# Patient Record
Sex: Female | Born: 1935 | Race: White | Hispanic: No | Smoking: Never smoker
Health system: Southern US, Community
[De-identification: ages and names within clinical notes are randomized; demographics above are authoritative.]

## PROBLEM LIST (undated history)

## (undated) DIAGNOSIS — J189 Pneumonia, unspecified organism: Secondary | ICD-10-CM

## (undated) DIAGNOSIS — M4800 Spinal stenosis, site unspecified: Secondary | ICD-10-CM

## (undated) DIAGNOSIS — F32A Depression, unspecified: Secondary | ICD-10-CM

## (undated) DIAGNOSIS — R41841 Cognitive communication deficit: Secondary | ICD-10-CM

## (undated) DIAGNOSIS — I456 Pre-excitation syndrome: Secondary | ICD-10-CM

## (undated) DIAGNOSIS — E569 Vitamin deficiency, unspecified: Secondary | ICD-10-CM

## (undated) DIAGNOSIS — M1990 Unspecified osteoarthritis, unspecified site: Secondary | ICD-10-CM

## (undated) DIAGNOSIS — K589 Irritable bowel syndrome without diarrhea: Secondary | ICD-10-CM

## (undated) DIAGNOSIS — R471 Dysarthria and anarthria: Secondary | ICD-10-CM

## (undated) DIAGNOSIS — I10 Essential (primary) hypertension: Secondary | ICD-10-CM

## (undated) DIAGNOSIS — D649 Anemia, unspecified: Secondary | ICD-10-CM

## (undated) DIAGNOSIS — E871 Hypo-osmolality and hyponatremia: Secondary | ICD-10-CM

## (undated) DIAGNOSIS — R1310 Dysphagia, unspecified: Secondary | ICD-10-CM

## (undated) DIAGNOSIS — K5900 Constipation, unspecified: Secondary | ICD-10-CM

## (undated) DIAGNOSIS — K219 Gastro-esophageal reflux disease without esophagitis: Secondary | ICD-10-CM

## (undated) DIAGNOSIS — N3281 Overactive bladder: Secondary | ICD-10-CM

## (undated) DIAGNOSIS — M810 Age-related osteoporosis without current pathological fracture: Secondary | ICD-10-CM

## (undated) DIAGNOSIS — F329 Major depressive disorder, single episode, unspecified: Secondary | ICD-10-CM

## (undated) DIAGNOSIS — H9190 Unspecified hearing loss, unspecified ear: Secondary | ICD-10-CM

## (undated) DIAGNOSIS — C801 Malignant (primary) neoplasm, unspecified: Secondary | ICD-10-CM

## (undated) DIAGNOSIS — M5430 Sciatica, unspecified side: Secondary | ICD-10-CM

## (undated) DIAGNOSIS — G459 Transient cerebral ischemic attack, unspecified: Secondary | ICD-10-CM

## (undated) DIAGNOSIS — I67848 Other cerebrovascular vasospasm and vasoconstriction: Secondary | ICD-10-CM

## (undated) DIAGNOSIS — E785 Hyperlipidemia, unspecified: Secondary | ICD-10-CM

## (undated) HISTORY — PX: MASTECTOMY: SHX3

## (undated) HISTORY — PX: ABDOMINAL HYSTERECTOMY: SHX81

## (undated) HISTORY — PX: REPLACEMENT TOTAL KNEE: SUR1224

---

## 2003-05-18 ENCOUNTER — Other Ambulatory Visit: Payer: Self-pay

## 2004-03-30 ENCOUNTER — Ambulatory Visit: Payer: Self-pay | Admitting: Unknown Physician Specialty

## 2004-03-30 ENCOUNTER — Other Ambulatory Visit: Payer: Self-pay

## 2004-03-31 ENCOUNTER — Ambulatory Visit: Payer: Self-pay | Admitting: Unknown Physician Specialty

## 2004-04-11 ENCOUNTER — Ambulatory Visit: Payer: Self-pay | Admitting: Oncology

## 2004-04-18 ENCOUNTER — Ambulatory Visit: Payer: Self-pay | Admitting: Oncology

## 2004-05-19 ENCOUNTER — Ambulatory Visit: Payer: Self-pay | Admitting: Oncology

## 2004-10-03 ENCOUNTER — Ambulatory Visit: Payer: Self-pay | Admitting: Unknown Physician Specialty

## 2004-10-24 ENCOUNTER — Ambulatory Visit: Payer: Self-pay | Admitting: Oncology

## 2004-11-18 ENCOUNTER — Ambulatory Visit: Payer: Self-pay | Admitting: Oncology

## 2005-05-08 ENCOUNTER — Ambulatory Visit: Payer: Self-pay | Admitting: Oncology

## 2005-08-28 ENCOUNTER — Ambulatory Visit: Payer: Self-pay | Admitting: Unknown Physician Specialty

## 2005-09-26 ENCOUNTER — Ambulatory Visit: Payer: Self-pay | Admitting: Family Medicine

## 2005-11-27 ENCOUNTER — Ambulatory Visit: Payer: Self-pay | Admitting: Oncology

## 2005-12-19 ENCOUNTER — Ambulatory Visit: Payer: Self-pay | Admitting: Oncology

## 2006-01-17 ENCOUNTER — Other Ambulatory Visit: Payer: Self-pay

## 2006-01-17 ENCOUNTER — Inpatient Hospital Stay: Payer: Self-pay | Admitting: Internal Medicine

## 2006-05-14 ENCOUNTER — Ambulatory Visit: Payer: Self-pay | Admitting: Oncology

## 2006-09-03 ENCOUNTER — Emergency Department: Payer: Self-pay | Admitting: Emergency Medicine

## 2006-09-03 ENCOUNTER — Other Ambulatory Visit: Payer: Self-pay

## 2006-11-19 ENCOUNTER — Ambulatory Visit: Payer: Self-pay | Admitting: Oncology

## 2006-11-26 ENCOUNTER — Ambulatory Visit: Payer: Self-pay | Admitting: Oncology

## 2006-12-20 ENCOUNTER — Ambulatory Visit: Payer: Self-pay | Admitting: Oncology

## 2007-02-23 ENCOUNTER — Ambulatory Visit: Payer: Self-pay | Admitting: Internal Medicine

## 2007-03-26 ENCOUNTER — Ambulatory Visit: Payer: Self-pay | Admitting: Otolaryngology

## 2007-04-01 ENCOUNTER — Ambulatory Visit: Payer: Self-pay | Admitting: Otolaryngology

## 2007-06-03 ENCOUNTER — Ambulatory Visit: Payer: Self-pay | Admitting: Oncology

## 2007-08-31 ENCOUNTER — Ambulatory Visit: Payer: Self-pay | Admitting: Oncology

## 2007-12-20 ENCOUNTER — Ambulatory Visit: Payer: Self-pay | Admitting: Oncology

## 2007-12-21 ENCOUNTER — Ambulatory Visit: Payer: Self-pay | Admitting: Oncology

## 2008-01-19 ENCOUNTER — Ambulatory Visit: Payer: Self-pay | Admitting: Oncology

## 2008-02-04 ENCOUNTER — Ambulatory Visit: Payer: Self-pay | Admitting: Unknown Physician Specialty

## 2008-05-07 ENCOUNTER — Ambulatory Visit: Payer: Self-pay | Admitting: Family Medicine

## 2008-06-16 ENCOUNTER — Ambulatory Visit: Payer: Self-pay | Admitting: Oncology

## 2008-12-19 ENCOUNTER — Ambulatory Visit: Payer: Self-pay | Admitting: Oncology

## 2009-01-03 ENCOUNTER — Ambulatory Visit: Payer: Self-pay | Admitting: Oncology

## 2009-01-18 ENCOUNTER — Ambulatory Visit: Payer: Self-pay | Admitting: Oncology

## 2009-02-15 ENCOUNTER — Emergency Department: Payer: Self-pay | Admitting: Unknown Physician Specialty

## 2009-02-18 ENCOUNTER — Ambulatory Visit: Payer: Self-pay | Admitting: Oncology

## 2009-03-14 ENCOUNTER — Ambulatory Visit: Payer: Self-pay | Admitting: Oncology

## 2009-03-21 ENCOUNTER — Ambulatory Visit: Payer: Self-pay | Admitting: Oncology

## 2009-03-27 ENCOUNTER — Ambulatory Visit: Payer: Self-pay | Admitting: Oncology

## 2009-04-18 ENCOUNTER — Ambulatory Visit: Payer: Self-pay | Admitting: Oncology

## 2009-05-02 ENCOUNTER — Ambulatory Visit: Payer: Self-pay | Admitting: Oncology

## 2009-05-19 ENCOUNTER — Ambulatory Visit: Payer: Self-pay | Admitting: Oncology

## 2009-07-11 ENCOUNTER — Ambulatory Visit: Payer: Self-pay | Admitting: Oncology

## 2009-07-19 ENCOUNTER — Ambulatory Visit: Payer: Self-pay | Admitting: Oncology

## 2009-08-09 ENCOUNTER — Ambulatory Visit: Payer: Self-pay | Admitting: Unknown Physician Specialty

## 2010-03-29 ENCOUNTER — Ambulatory Visit: Payer: Self-pay | Admitting: Oncology

## 2010-07-26 ENCOUNTER — Inpatient Hospital Stay: Payer: Self-pay | Admitting: Internal Medicine

## 2011-04-02 ENCOUNTER — Ambulatory Visit: Payer: Self-pay | Admitting: Oncology

## 2011-06-19 ENCOUNTER — Ambulatory Visit: Payer: Self-pay | Admitting: Oncology

## 2011-07-06 ENCOUNTER — Emergency Department: Payer: Self-pay | Admitting: Emergency Medicine

## 2011-07-11 ENCOUNTER — Observation Stay: Payer: Self-pay | Admitting: Specialist

## 2011-07-11 LAB — CK TOTAL AND CKMB (NOT AT ARMC)
CK, Total: 69 U/L (ref 21–215)
CK-MB: 2.5 ng/mL (ref 0.5–3.6)

## 2011-07-11 LAB — CBC WITH DIFFERENTIAL/PLATELET
Basophil %: 0.6 %
Eosinophil %: 0.3 %
HCT: 34.4 % — ABNORMAL LOW (ref 35.0–47.0)
Lymphocyte %: 9.8 %
MCHC: 33.8 g/dL (ref 32.0–36.0)
MCV: 95 fL (ref 80–100)
Monocyte #: 0.8 x10 3/mm (ref 0.2–0.9)
Monocyte %: 9.3 %
Neutrophil %: 80 %
RBC: 3.63 10*6/uL — ABNORMAL LOW (ref 3.80–5.20)
WBC: 8.2 10*3/uL (ref 3.6–11.0)

## 2011-07-11 LAB — URINALYSIS, COMPLETE
Bilirubin,UR: NEGATIVE
Blood: NEGATIVE
Glucose,UR: NEGATIVE mg/dL (ref 0–75)
Ph: 8 (ref 4.5–8.0)
Protein: NEGATIVE
Squamous Epithelial: <1

## 2011-07-11 LAB — COMPREHENSIVE METABOLIC PANEL
Alkaline Phosphatase: 83 U/L (ref 50–136)
Anion Gap: 9 (ref 7–16)
BUN: 20 mg/dL — ABNORMAL HIGH (ref 7–18)
Calcium, Total: 8.6 mg/dL (ref 8.5–10.1)
Chloride: 96 mmol/L — ABNORMAL LOW (ref 98–107)
Co2: 24 mmol/L (ref 21–32)
EGFR (Non-African Amer.): >60
Osmolality: 261 (ref 275–301)
Potassium: 4.7 mmol/L (ref 3.5–5.1)
SGOT(AST): 25 U/L (ref 15–37)
SGPT (ALT): 18 U/L
Total Protein: 6.2 g/dL — ABNORMAL LOW (ref 6.4–8.2)

## 2011-07-11 LAB — PROTIME-INR
INR: 1
Prothrombin Time: 13.9 secs (ref 11.5–14.7)

## 2011-07-11 LAB — TROPONIN I: Troponin-I: <0.02 ng/mL

## 2011-07-12 LAB — LIPID PANEL
Ldl Cholesterol, Calc: 59 mg/dL (ref 0–100)
Triglycerides: 47 mg/dL (ref 0–200)

## 2011-07-12 LAB — BASIC METABOLIC PANEL
Anion Gap: 6 — ABNORMAL LOW (ref 7–16)
Co2: 26 mmol/L (ref 21–32)
Creatinine: 0.6 mg/dL (ref 0.60–1.30)
Glucose: 84 mg/dL (ref 65–99)
Osmolality: 265 (ref 275–301)
Sodium: 132 mmol/L — ABNORMAL LOW (ref 136–145)

## 2011-07-18 LAB — PROT IMMUNOELECTROPHORES(ARMC)

## 2011-07-18 LAB — CANCER ANTIGEN 27.29: CA 27.29: 8.6 U/mL (ref 0.0–38.6)

## 2011-07-20 ENCOUNTER — Ambulatory Visit: Payer: Self-pay | Admitting: Oncology

## 2011-11-11 ENCOUNTER — Emergency Department: Payer: Self-pay | Admitting: Emergency Medicine

## 2011-11-11 LAB — COMPREHENSIVE METABOLIC PANEL
Anion Gap: 9 (ref 7–16)
BUN: 11 mg/dL (ref 7–18)
Bilirubin,Total: 0.4 mg/dL (ref 0.2–1.0)
Calcium, Total: 9.4 mg/dL (ref 8.5–10.1)
Chloride: 101 mmol/L (ref 98–107)
Co2: 27 mmol/L (ref 21–32)
Creatinine: 0.59 mg/dL — ABNORMAL LOW (ref 0.60–1.30)
EGFR (African American): >60
EGFR (Non-African Amer.): >60
Osmolality: 273 (ref 275–301)
Potassium: 3.9 mmol/L (ref 3.5–5.1)
SGPT (ALT): 23 U/L (ref 12–78)
Sodium: 137 mmol/L (ref 136–145)
Total Protein: 7.2 g/dL (ref 6.4–8.2)

## 2011-11-11 LAB — CBC
HGB: 13.4 g/dL (ref 12.0–16.0)
Platelet: 238 10*3/uL (ref 150–440)

## 2011-11-11 LAB — CK TOTAL AND CKMB (NOT AT ARMC)
CK, Total: 67 U/L (ref 21–215)
CK-MB: 1.7 ng/mL (ref 0.5–3.6)

## 2011-11-11 LAB — TROPONIN I: Troponin-I: <0.02 ng/mL

## 2012-01-20 ENCOUNTER — Emergency Department: Payer: Self-pay | Admitting: Emergency Medicine

## 2012-02-13 ENCOUNTER — Emergency Department: Payer: Self-pay | Admitting: Emergency Medicine

## 2012-05-14 ENCOUNTER — Ambulatory Visit: Payer: Self-pay

## 2012-05-14 LAB — COMPREHENSIVE METABOLIC PANEL WITH GFR
Albumin: 3.5 g/dL
Alkaline Phosphatase: 96 U/L
Anion Gap: 9
BUN: 16 mg/dL
Bilirubin,Total: 0.2 mg/dL
Calcium, Total: 9 mg/dL
Chloride: 97 mmol/L — ABNORMAL LOW
Co2: 26 mmol/L
Creatinine: 0.72 mg/dL
EGFR (African American): >60
EGFR (Non-African Amer.): >60
Glucose: 100 mg/dL — ABNORMAL HIGH
Osmolality: 266
Potassium: 4.2 mmol/L
SGOT(AST): 23 U/L
SGPT (ALT): 25 U/L
Sodium: 132 mmol/L — ABNORMAL LOW
Total Protein: 6.3 g/dL — ABNORMAL LOW

## 2012-05-14 LAB — CBC WITH DIFFERENTIAL/PLATELET
Basophil #: 0 10*3/uL (ref 0.0–0.1)
Basophil %: 1 %
Eosinophil %: 0.5 %
HCT: 37.2 % (ref 35.0–47.0)
HGB: 12.5 g/dL (ref 12.0–16.0)
Lymphocyte #: 1 10*3/uL (ref 1.0–3.6)
MCH: 31.3 pg (ref 26.0–34.0)
MCV: 93 fL (ref 80–100)
Neutrophil #: 3.2 10*3/uL (ref 1.4–6.5)
Platelet: 190 10*3/uL (ref 150–440)
RDW: 14.4 % (ref 11.5–14.5)
WBC: 4.7 10*3/uL (ref 3.6–11.0)

## 2012-09-02 ENCOUNTER — Ambulatory Visit: Payer: Self-pay | Admitting: Family Medicine

## 2014-03-31 IMAGING — US US EXTREM LOW VENOUS*L*
1 series · 14 of 24 positions shown · non-contrast
Comparison: none

REASON FOR EXAM: swelling in one leg
COMMENTS:

[Series 1: us extrem low venous*left* · 14 of 29 slices shown]
[im 1/29]
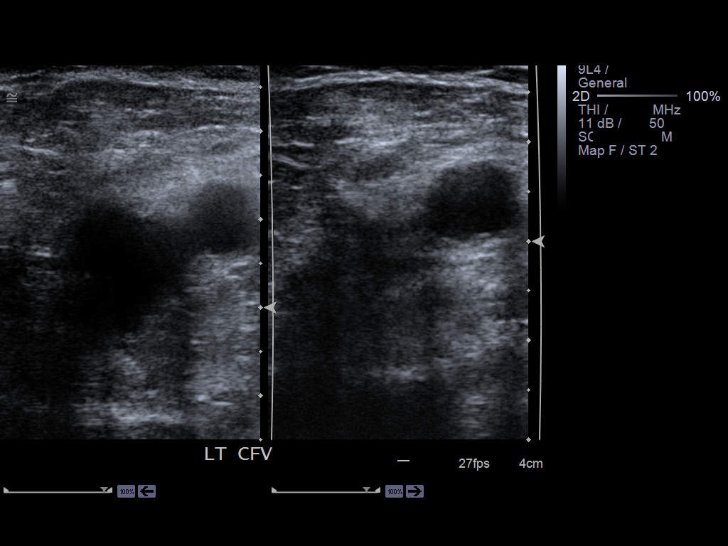
[im 3/29]
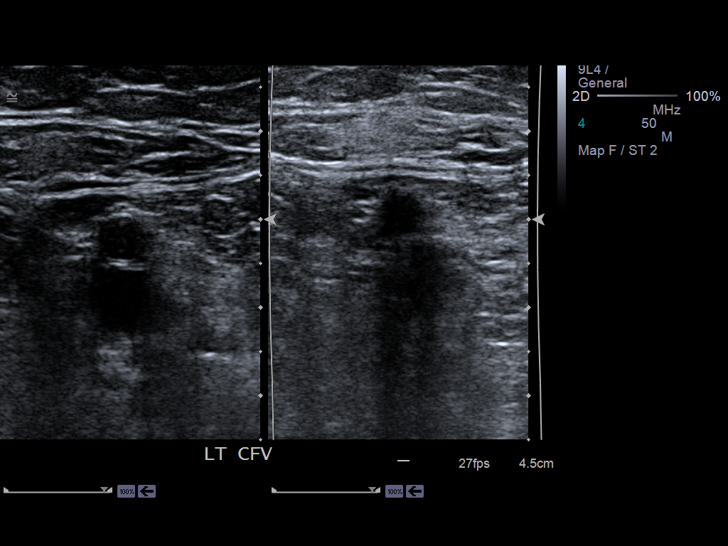
[im 5/29]
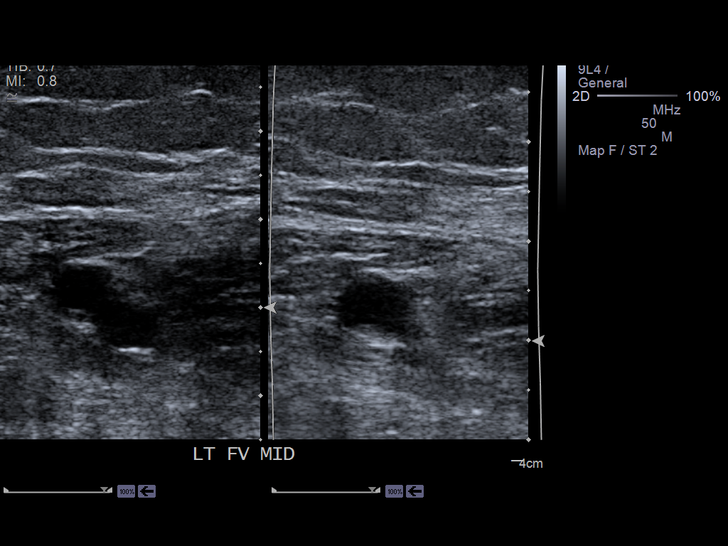
[im 8/29]
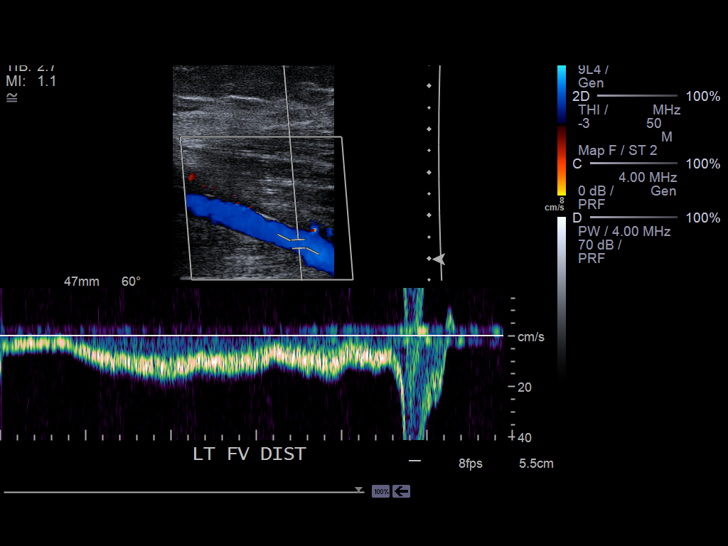
[im 9/29]
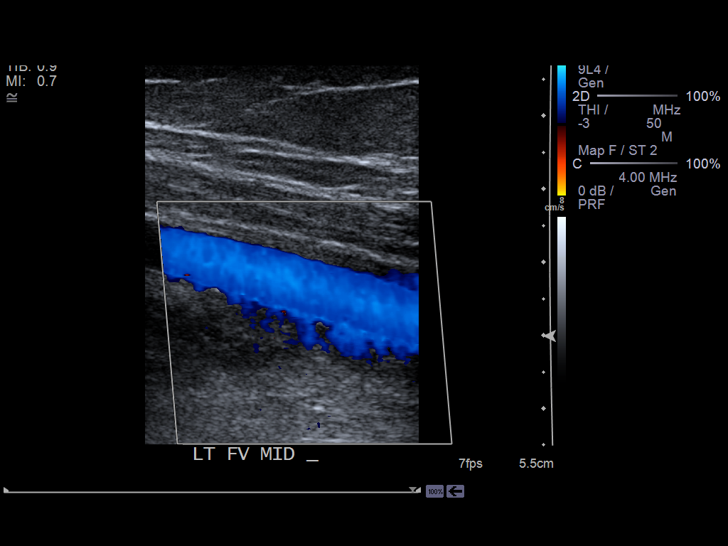
[im 11/29]
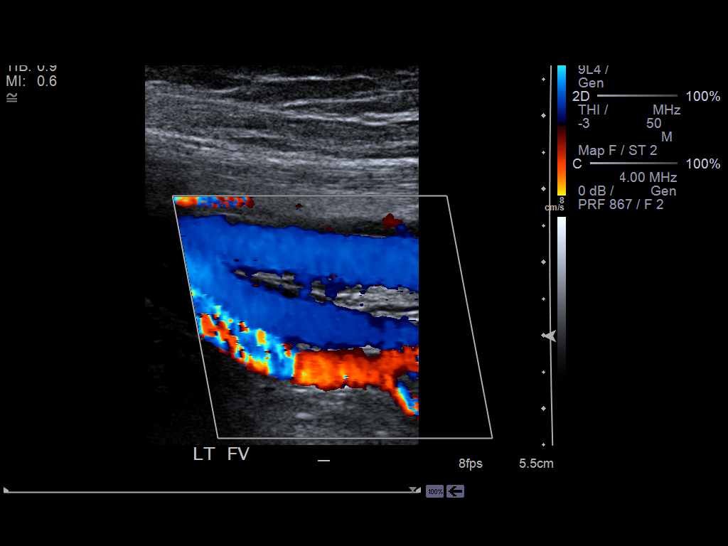
[im 14/29]
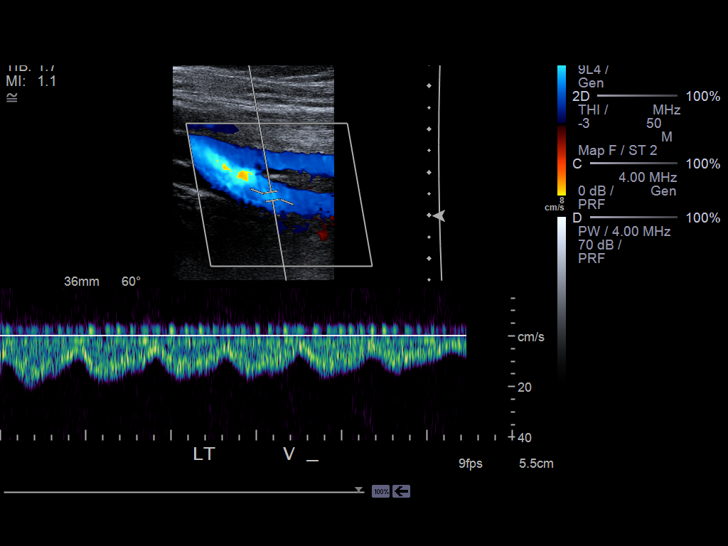
[im 15/29]
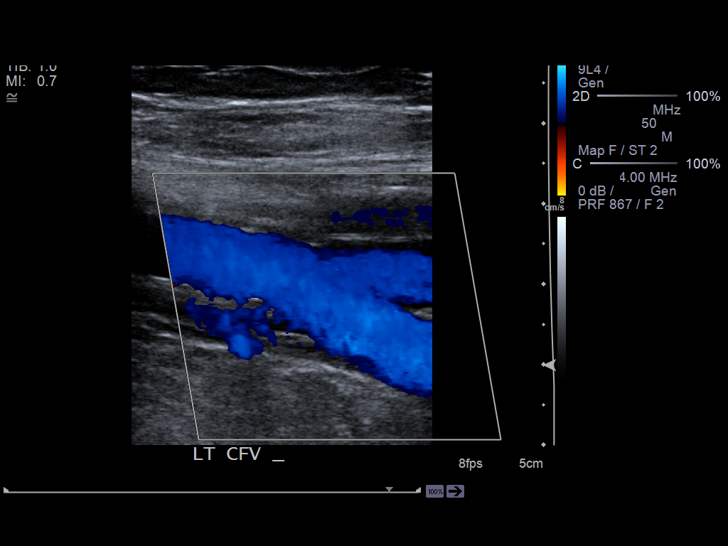
[im 18/29]
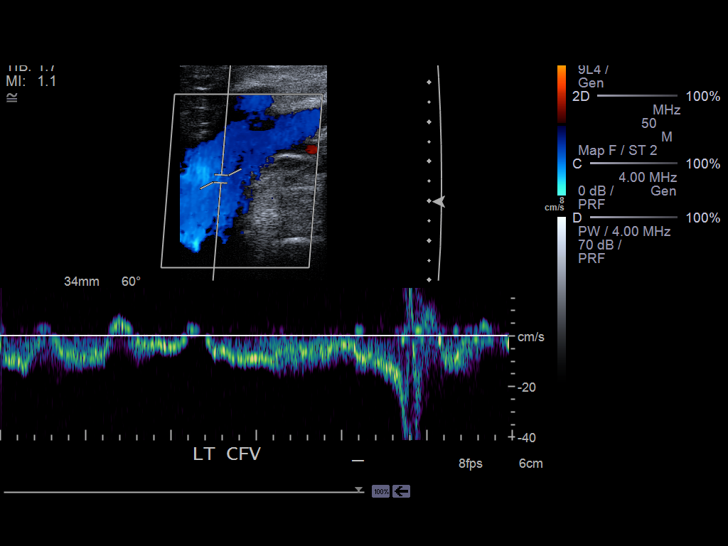
[im 20/29]
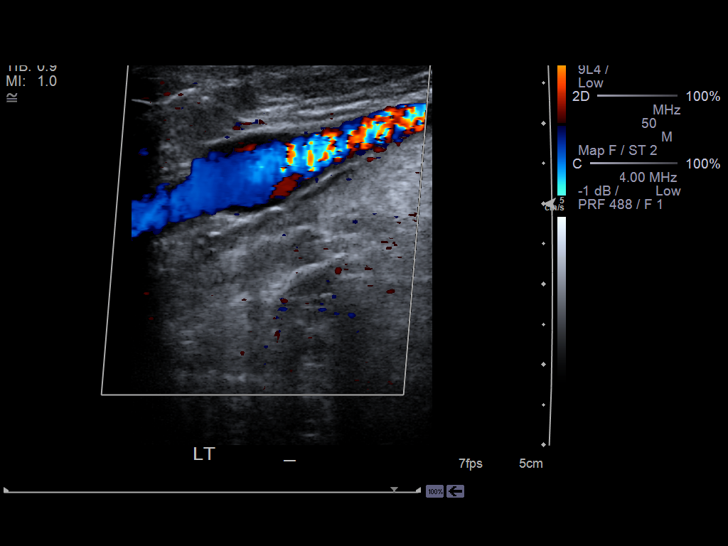
[im 22/29]
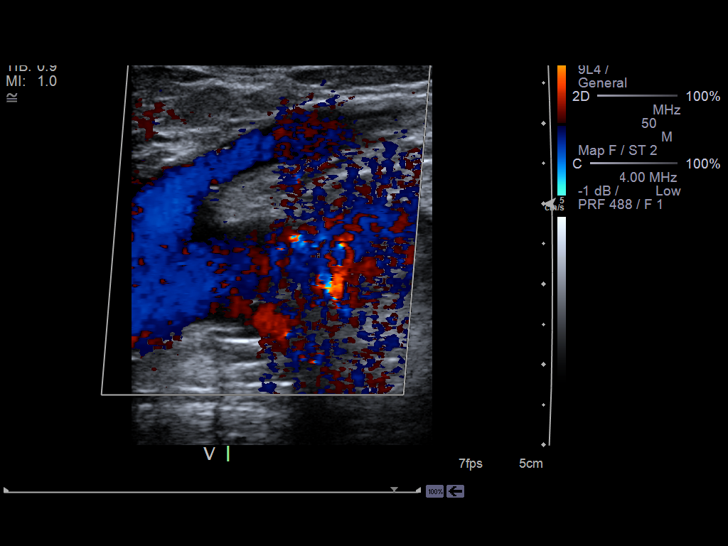
[im 24/29]
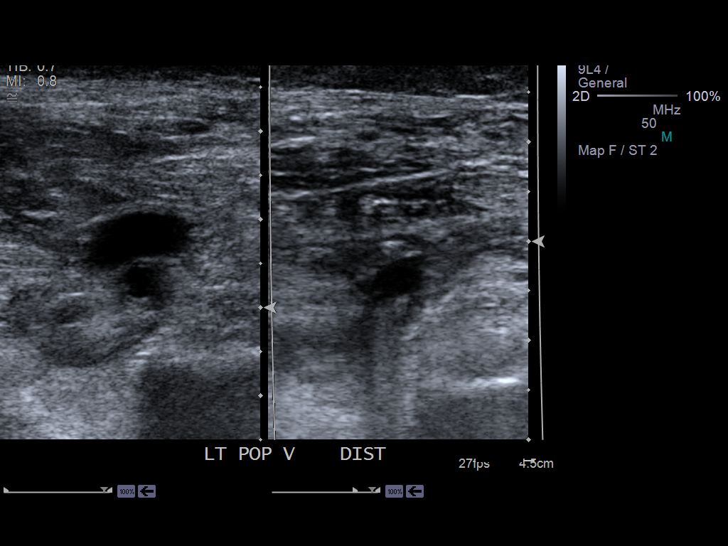
[im 26/29]
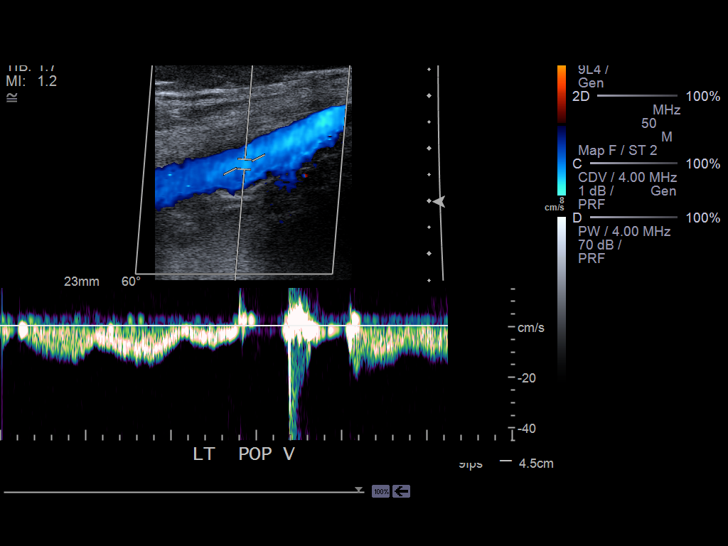
[im 29/29]
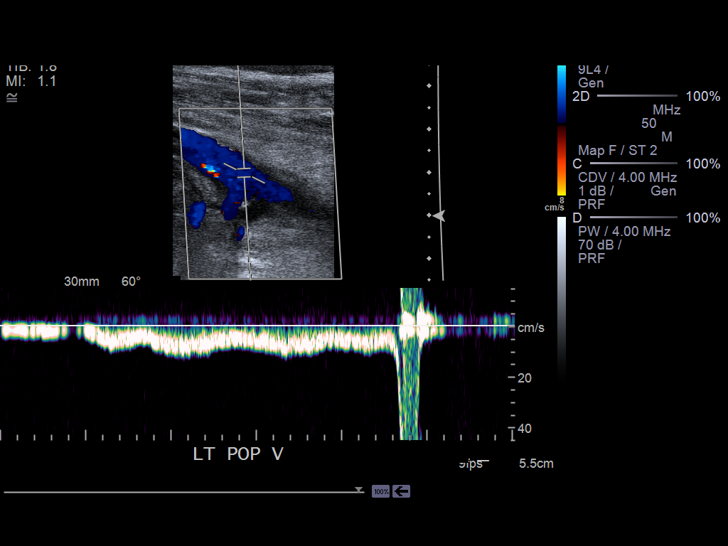

[14 of 24 positions shown; findings below may reference images not displayed]

PROCEDURE:     US  - US DOPPLER LOW EXTR LEFT  - February 13, 2012  [DATE]

RESULT:     Comparison: Report from Doppler ultrasound left lower extremity
11/27/2005. The images are not available.

Technique and findings: Multiple longitudinal and transverse grayscale as
well as color and spectral Doppler images of the left lower extremity veins
were obtained from the common femoral veins through the popliteal veins.

The left common femoral, femoral, and popliteal veins are patent,
demonstrating normal color-flow and compressibility. No intraluminal
thrombus is identified.  There is normal respiratory variation and
augmentation demonstrated at all vein levels.
IMPRESSION: No evidence of DVT in the left lower extremity.

[REDACTED]

## 2014-06-12 NOTE — H&P (Signed)
PATIENT NAME:  [name], [name] MR#:  [id number] DATE OF BIRTH:  Jun 18, 1923  DATE OF ADMISSION:  07/11/2011  ADDENDUM: The patient's CAT of the head showed chronic changes, no acute intracranial abnormality. ____________________________ Demetrios Loll, MD qc:slb D: 07/11/2011 04:01:48 ET T: 07/11/2011 11:02:22 ET JOB#: [id number]  cc: Demetrios Loll, MD, <Dictator> Demetrios Loll MD ELECTRONICALLY SIGNED 07/12/2011 0:55

## 2014-06-12 NOTE — Consult Note (Signed)
Brief Consult Note: Diagnosis: T12 compression fracture.   Patient was seen by consultant.   Recommend further assessment or treatment.   Orders entered.   Comments: will see how she does with PT and getting up.  if pain is too severe for rehab to take place, would rec kyphoplasty.  Electronic Signatures: Laurene Footman (MD)  (Signed [phone] 16:33)  Authored: Brief Consult Note   Last Updated: 23-May-13 16:33 by Laurene Footman (MD)

## 2014-06-12 NOTE — Consult Note (Signed)
PATIENT NAME:  [name], [name] MR#:  [id number] DATE OF BIRTH:  Feb 19, 1923  DATE OF CONSULTATION:  07/11/2011  REFERRING PHYSICIAN:   CONSULTING PHYSICIAN:  Laurene Footman, MD  HISTORY OF PRESENT ILLNESS: The patient is an 81 year old who was admitted because of pain, weakness, recent falls, and compression fracture. Her history is significant for initial fall about two weeks ago that really was a setback for her.  Since then she has had difficulty walking. She also has a history of prior breast cancer. She has had some chronic low back pain for years with some altered sensation in the left lower extremity. She has some increased pain in the back since her fall. She had a CT on admission that showed a T12 fracture. She has had a great deal of difficulty walking since her initial fall two weeks ago. On exam she has diminished sensation to the left lower extremity in a stocking distribution, presumably peripheral neuropathy. She has two to three beats of clonus on both feet. She is able to flex and extend the toes. Reflexes are brisk and symmetric. Her x-ray, CT, and subsequent MRI were reviewed. She appears to have a benign compression fracture with no evidence of metastatic disease on the MRI. Superior endplate fracture of G28. On examination, she is also tender to percussion over the spinous process and this is moderate.   CLINICAL IMPRESSION: T12 compression fracture.   RECOMMENDATION: Trial of therapy to see if she can ambulate and tolerate the pain. If she cannot, she could undergo kyphoplasty. I discussed the procedure at length with the patient and her son. I think it is reasonable to perform this. If she is limited secondary to this acute compression fracture, understanding that there are significant limitations to her ambulatory status and it may be difficult to tell, but I told her I do think that she is likely to be able to determine if she has a sharp pain at the T12 level as opposed to her chronic  low back pain, and we will see how she does over the next several days. Again, if needed I think she could have kyphoplasty if pain is persistent for relief of pain but would not expect it to otherwise improve her back or leg function related to her chronic conditions.     ____________________________ Laurene Footman, MD mjm:bjt D:  07/12/2011 09:41:15 ET          T: 07/12/2011 13:17:42 ET        JOB#: [id number]  cc: Laurene Footman, MD, <Dictator> Laurene Footman MD ELECTRONICALLY SIGNED 07/13/2011 10:03

## 2014-06-12 NOTE — Discharge Summary (Signed)
PATIENT NAME:  [name], [name] MR#:  [id number] DATE OF BIRTH:  [birth year]  DATE OF ADMISSION:  07/11/2011 DATE OF DISCHARGE:  07/12/2011  For a detailed note, please take a look at the history and physical done by Dr. Bridgett Larsson on admission.   DISCHARGE DIAGNOSES:  1. Compression fracture of the thoracic spine secondary to a fall.  2. History of breast cancer.  3. Hyponatremia.  4. Hypertension.  5. Osteoarthritis.   DIET: The patient is being discharged on a low-sodium diet.   ACTIVITY: As tolerated.   DISCHARGE FOLLOWUP: Followup with Dr. Gayland Curry in the next 1 to 2 weeks.   DISCHARGE MEDICATIONS:  1. Zofran 4 mg every four hours as needed.  2. Ketorolac 10 mg three times daily as needed. 3. Ultram 50 mg every 6 hours as needed.  4. Benazepril 20 mg daily.  5. Centrum multivitamin daily.  6. Amlodipine 5 mg daily.  7. Ibuprofen 200 mg daily.  8. Metoprolol 50 mg daily. 9. Aspirin 81 mg daily.   CONSULTANTS:  1. Forest Gleason, MD - Oncology. 2. Hessie Knows, MD - Orthopedics.   PERTINENT STUDIES: CT scan of the head done without contrast on admission showed normal noncontrast CT.   CT scan of the chest done with contrast showed no evidence of acute pulmonary embolism. New small bilateral pleural effusions. No bulky mediastinal or hilar adenopathy. Partial compression of the body of T12 without evidence of high-grade spinal canal stenosis.   MRI of the thoracic spine was done without contrast and showed a T12 compression fracture. The compression fracture is severe. There is retropulsion of the posterior portion of T12 with mild thecal sac narrowing at that level.   HOSPITAL COURSE: This is an 81 year old female with medical problems as mentioned above who presented to the hospital on 07/11/2011 secondary to back pain and recurrent falls.  1. Acute T12 compression fracture. The patient presented to the hospital with recurrent falls. She complained of some nonspecific back  pain. She underwent a CT scan of her chest to rule out PE as her d-dimer was elevated, but there was noted to be some abnormality of the T12 vertebrae. She underwent a MRI of the thoracic spine which confirmed the compression fracture. The patient was seen in consultation by Dr. Hessie Knows from orthopedics. After his discussion with the family they opted for more conservative treatment with pain control and physical therapy rather than kyphoplasty presently. The patient has been seen by physical therapy and is currently being discharged to a skilled nursing facility for ongoing rehab.  2. History of breast cancer. The patient has been followed by Dr. Oliva Bustard. The patient was seen by Dr. Oliva Bustard. He did not think that the compression fracture could be related to a metastatic process. He recommended continued supportive care for now.  3. Hyponatremia. The patient presented to the hospital with a sodium of 129. She was gently hydrated with IV fluids. His sodium is now improved to 132.  4. Hypertension. The patient is presently hemodynamically stable. She will continue her home medications including Norvasc, benazepril, and Toprol as mentioned.  5. Osteoarthritis.  The patient will continue to take her ketorolac and ibuprofen for pain control.            CODE STATUS: FULL CODE.   TIME SPENT: 40 minutes. ____________________________ Belia Heman. Verdell Carmine, MD vjs:slb D: 07/12/2011 11:57:43 ET T: 07/12/2011 12:19:46 ET JOB#: [id number]  cc: Belia Heman. Verdell Carmine, MD, <Dictator> Floria Raveling. Astrid Divine, MD Belia Heman  Verdell Carmine MD ELECTRONICALLY SIGNED 07/12/2011 15:55

## 2014-06-12 NOTE — H&P (Signed)
PATIENT NAME:  [name], CATALINA MR#:  [id number] DATE OF BIRTH:  [birth year]  DATE OF ADMISSION:  07/11/2011  PRIMARY CARE PHYSICIAN: Gayland Curry, MD   REFERRING PHYSICIAN: Dr. Owens Shark   CHIEF COMPLAINT: [place] twice yesterday.   HISTORY OF PRESENT ILLNESS: The patient is an 81 year old Caucasian female with a history of TIA, WPW syndrome since birth, history of breast cancer status post left breast mastectomy with radiation to the spine 26 years ago who presented to the ED with a fall twice last night. The patient is alert, awake, oriented in no acute distress. According to her, she has been feeling weak with decreased appetite for about two weeks. She fell 10 days ago and fell twice yesterday but she denies any syncope, loss of consciousness, or seizure. She thinks she fell by accident. She denies any headache, dizziness, or incontinence. No slurred speech or dysphagia. Since the patient was noted to have an elevated D-dimer, CT angio was done which showed no PE but a T12 fracture.   PAST MEDICAL HISTORY:  1. Transient ischemic attack. 2. WPW syndrome. 3. Breast cancer status post surgery. History of recurrence to the bones, received radiation 26 years ago.   PAST SURGICAL HISTORY:  1. Left mastectomy. 2. Hysterectomy. 3. Right knee replacement.   SOCIAL HISTORY: Denies any smoking, alcohol drinking, or illicit drugs. The patient is living alone.   FAMILY HISTORY: Father had lung cancer. Mother had renal cancer. All siblings have some form of cancer.    ALLERGIES: Codeine.   HOME MEDICATIONS:  1. Norvasc 5 mg p.o. daily.  2. Aspirin 81 mg p.o. daily.  3. Benazepril 20 mg p.o. daily.  4. Centrum 1 tablet p.o. daily.  5. Ibuprofen 200 mg p.o. daily.  6. Ketorolac  10 mg p.o. t.i.d. p.r.n.  7. Lopressor 50 mg p.o. daily.  8. Ultram 50 mg p.o. b.i.d. p.r.n.  9. Zofran 1 tablet 4 mg p.o. q.4 hours p.r.n. for nausea, vomiting.  10. Orphenadrine 100 mg extended-release twice daily for  muscle spasm.   REVIEW OF SYSTEMS: CONSTITUTIONAL: The patient denies any fever or chills. No headache but has dizziness and generalized weakness. Has weight loss. EYES: No double vision, blurred vision. ENT: No epistaxis, postnasal drip, or ear pain. No hearing loss. RESPIRATORY: No cough, sputum, shortness of breath, or hemoptysis. CARDIOVASCULAR: No chest pain, palpitation, orthopnea, nocturnal dyspnea. No leg edema. GI: No nausea, vomiting, or diarrhea. No abdominal pain, melena, or bloody stool. GU: No dysuria, hematuria, or incontinence. HEMATOLOGY: No easy bruising or bleeding. ENDOCRINE: No polyuria, polydipsia, heat or cold intolerance. NEUROLOGY: No syncope, loss of consciousness, or seizure.   PHYSICAL EXAMINATION:   VITALS: Temperature 98, blood pressure 183/77, pulse 77, respirations 20, oxygen saturation 95% on room air.   GENERAL: The patient is alert, awake, oriented in no acute distress.   HEENT: Pupils round, equal, reactive to light and accommodation. Very dry oral mucosa. Clear oropharynx.   NECK: Supple. No JVD or carotid bruits. No lymphadenopathy. No thyromegaly.   CARDIOVASCULAR: S1, S2 regular rate and rhythm. No murmurs or gallops.   PULMONARY: Bilateral air entry. No wheezing or rales.  ABDOMEN: Soft. No distention or tenderness. No organomegaly. Bowel sounds present.   EXTREMITIES: No edema, clubbing, or cyanosis. No calf tenderness. Strong bilateral pedal pulses.   SKIN: No rash or jaundice.   NEUROLOGIC: Alert and oriented x3. No focal deficit. Power 5 out of 5. Sensation intact. Deep tendon reflexes 2+.   LABORATORY, DIAGNOSTIC, AND RADIOLOGICAL  DATA: Urinalysis negative. CK 69. CK-MB 2.5. WBC 8.2, hemoglobin 11.6, platelets 278, glucose 90, BUN 20, creatinine 0.81, sodium 129, potassium 4.7, chloride 96, bicarb 24. D-dimer 3.96. INR 1.0. Troponin less than 0.02.   EKG showed sinus rhythm with supraventricular complex at 81 beats per minute.   IMPRESSION:   1. Frequent falls.  2. Generalized weakness.  3. T12 compression fracture.  4. Hyponatremia.  5. Dehydration.  6. Uncontrolled hypertension.  7. Anemia.  8. History of breast cancer status post surgery, radiation to the spine.   PLAN OF TREATMENT:  1. The patient will be placed for observation. Fall precaution. Physical therapy evaluation. Case Management consult for placement.  2. For hyponatremia, we will start normal saline. Follow-up BMP.  3. We will get an Oncology consult to rule out metastasis.  4. We will continue hypertension medication.  5. GI and DVT prophylaxis.   Discussed the patient's situation and plan of treatment with the patient and patient's son.     TIME SPENT: About 65 minutes.  ____________________________ Demetrios Loll, MD qc:drc D: 07/11/2011 03:56:49 ET T: 07/11/2011 11:16:01 ET JOB#: [id number]  cc: Demetrios Loll, MD, <Dictator> Floria Raveling. Astrid Divine, MD Demetrios Loll MD ELECTRONICALLY SIGNED 07/12/2011 1:03

## 2014-06-12 NOTE — Consult Note (Signed)
History of Present Illness:   Reason for Consult Compression fracture of T12 spine.  Previous history of carcinoma of breast    HPI   patient was admitted in the hospital with a frequent fall.  MRI scan of the spine shows compression fracture of T12.  Degenerative disease.patient had a previous history of carcinoma of breast metastatic to bone presently not on any specific  treatment for breast cancer.gradually declining condition because of old age previous history of cerebrovascular accident  [place]:   Additional Past Medical and Surgical History Past Medical History  Wolff-Parkinson-White syndrome.  Anxiety.  Arthritis.  Hypertensive.   Past Surgical History  Modified radical mastectomy in November 1986 for carcinoma of breast.   Family History  No family history of colorectal cancer, breast cancer or ovarian cancer. Brother had lymphoma.     Social History  Does not smoke.  Does not drink alcohol.   Review of Systems:   General pain  fatigue    Performance Status (ECOG) 3    HEENT no complaints    Lungs no complaints    Cardiac no complaints    GI no complaints    GU no complaints    Musculoskeletal back pain  muscle ache  joint pain    Extremities pain    Skin no complaints    Neuro Dementia    Endocrine no complaints    Psych no complaints    Review of Systems   tragedy declining condition.  Frequent fall.  NURSING NOTES: Interdisciplinary Discharge Planning:   23-May-13 06:08    Advance Directive ([place]): yes    Advanced Directive in Record Manager?: No    Copy placed on chart?: No    Family to bring copy?: Yes  NURSING NOTES: **Vital Signs.:   24-May-13 05:32    Vital Signs Type: Routine    Temperature Temperature (F): 98.3    Celsius: 36.8    Temperature Source: oral    Pulse Pulse: 81    Pulse source: per Dinamap    Respirations Respirations: 19    Systolic BP Systolic BP: 751    Diastolic BP (mmHg) Diastolic  BP (mmHg): 76    Mean BP: 108    BP Source: Dinamap    Pulse Ox % Pulse Ox %: 94    Pulse Ox Activity Level: At rest    Oxygen Delivery: Room Air/ 21 %   Physical Exam:   General Patient is alert and oriented not in any acute distress    HEENT: normal    Lungs: clear    Cardiac: regular rate, rhythm    Abdomen: soft  nontender  positive bowel sounds    Skin: intact    Extremities: No edema, rash or cyanosis    Psych: alert and cooperative    Physical Exam Neurological system: Weakness on the left upper and lower extremity.. .  Cranial nerves are intact     hypertension:    bone cancer:    breast cancer:    cva:    atherosclerosis:    wolfe parkinson white syndrome:    mastectomy 1986:    rt tkr 05/2003:    hysterectomy:    Codeine: GI Distress    Zofran ODT 4 mg oral tablet, disintegrating: 1 tab(s) orally every 4 hours as needed for N&V, Active, 20, None   ketorolac 10 mg oral tablet: 1 tab(s) orally 1 to 3 times a day as needed for moderate pain , Active, 30, None  orphenadrine 100 mg oral tablet, extended release: 1 tab(s) orally 2 times a day for muscle spasms, Active, 30, None   Ultram 50 mg oral tablet: 1 tab(s) orally 2 times a day as needed , Active, 20, None   benazepril 20 mg oral tablet: 1 tab(s) orally once a day, Active, 0, None   Centrum: 1 tab(s) orally once a day, Active, 0, None   amlodipine 5 mg oral tablet: 1 tab(s) orally once a day, Active, 0, None   ibuprofen 200 mg oral tablet: 1 tab(s) orally once a day, Active, 0, None   metoprolol $RemoveBef'50mg'eJpPKbUQhT$ : 1 tab(s) orally once a day, Active, 0, None   aspirin 81 mg oral tablet: 1 tab(s) orally once a day, Active, 0, None   tramadol:  orally , Active, 0, None  Cardiac:  22-May-13 23:15    CK, Total 69   CPK-MB, Serum 2.5  Routine Hem:  22-May-13 23:15    WBC (CBC) 8.2   RBC (CBC) 3.63   Hemoglobin (CBC) 11.6   Hematocrit (CBC) 34.4   Platelet Count (CBC) 278   MCV 95   MCH  32.0   MCHC 33.8   RDW 13.5   Neutrophil % 80.0   Lymphocyte % 9.8   Monocyte % 9.3   Eosinophil % 0.3   Basophil % 0.6   Neutrophil # 6.5   Lymphocyte # 0.8   Monocyte # 0.8   Eosinophil # 0.0   Basophil # 0.0  Routine Chem:  22-May-13 23:15    Glucose, Serum 90   BUN 20   Creatinine (comp) 0.81   Sodium, Serum 129   Potassium, Serum 4.7   Chloride, Serum 96   CO2, Serum 24   Calcium (Total), Serum 8.6  Hepatic:  22-May-13 23:15    Bilirubin, Total 0.3   Alkaline Phosphatase 83   SGPT (ALT) 18   SGOT (AST) 25   Total Protein, Serum 6.2   Albumin, Serum 3.0  Routine Chem:  22-May-13 23:15    Osmolality (calc) 261   eGFR (African American) >60   eGFR (Non-African American) >60   Anion Gap 9  Routine Coag:  22-May-13 23:15    D-Dimer, Quantitative 3.96   Prothrombin 13.9   INR 1.0  Cardiac:  22-May-13 23:15    Troponin I < 0.02  Cardiology:  22-May-13 23:52    Ventricular Rate 81   Atrial Rate 81   P-R Interval 174   QRS Duration 72   QT 366   QTc 425   P Axis 75   R Axis -13   T Axis 57     18-May-13 16:26, Ribs Left Unilateral    23-May-13 00:40, CT Head Without Contrast    23-May-13 00:53, CT Chest for Pulm Embolism With Contrast    23-May-13 14:58, MRI Thoracic Spine Without Contrast   Assessment and Plan:  Impression:   1.MRI scan has been reviewed independently most of the changes are secondary to osteoporosis compression fracture there is no evidence of soft tissue mass or any evidence of metastatic disease.  There is definite left upper and lower extremity weakness which could be from previous  TIA.scan of the brain without contrast is been reported to be normal MRI scan of the brain with contrast can be considered. do not see any evidence of progressive breast cancer.  I discussed that with the patient and her son.  I do not know any neurosurgical evaluation for back problems can be helpful in this  point in time.  Agreed with supportive  therapy Check SIEP in tumor markers.  Will follow with  Electronic Signatures: Roye Gustafson, Martie Lee (MD)  (Signed [phone] 08:09)  Authored: HISTORY OF PRESENT ILLNESS, PFSH, ROS, NURSING NOTES, PE, PAST MEDICAL HISTORY, ALLERGIES, HOME MEDICATIONS, LABS, OTHER RESULTS, ASSESSMENT AND PLAN   Last Updated: 24-May-13 08:09 by Jobe Gibbon (MD)

## 2015-06-28 ENCOUNTER — Ambulatory Visit: Admit: 2015-06-28 | Payer: Self-pay | Admitting: Ophthalmology

## 2015-06-28 SURGERY — PHACOEMULSIFICATION, CATARACT, WITH IOL INSERTION
Anesthesia: Topical | Laterality: Left

## 2015-11-06 ENCOUNTER — Inpatient Hospital Stay
Admit: 2015-11-06 | Discharge: 2015-11-06 | Disposition: A | Discharge status: Home / Self Care | Payer: Medicare Other | Attending: Internal Medicine | Admitting: Internal Medicine

## 2015-11-06 ENCOUNTER — Inpatient Hospital Stay
Admission: EM | Admit: 2015-11-06 | Discharge: 2015-11-07 | DRG: 069 | Disposition: A | Discharge status: Home / Self Care | Payer: Medicare Other | Attending: Internal Medicine | Admitting: Internal Medicine

## 2015-11-06 ENCOUNTER — Encounter: Payer: Self-pay | Admitting: *Deleted

## 2015-11-06 ENCOUNTER — Inpatient Hospital Stay: Payer: Medicare Other

## 2015-11-06 ENCOUNTER — Emergency Department: Payer: Medicare Other

## 2015-11-06 DIAGNOSIS — G459 Transient cerebral ischemic attack, unspecified: Secondary | ICD-10-CM | POA: Diagnosis present

## 2015-11-06 DIAGNOSIS — Z7982 Long term (current) use of aspirin: Secondary | ICD-10-CM | POA: Diagnosis not present

## 2015-11-06 DIAGNOSIS — I456 Pre-excitation syndrome: Secondary | ICD-10-CM | POA: Diagnosis present

## 2015-11-06 DIAGNOSIS — E871 Hypo-osmolality and hyponatremia: Secondary | ICD-10-CM | POA: Diagnosis present

## 2015-11-06 DIAGNOSIS — R739 Hyperglycemia, unspecified: Secondary | ICD-10-CM

## 2015-11-06 DIAGNOSIS — R9431 Abnormal electrocardiogram [ECG] [EKG]: Secondary | ICD-10-CM | POA: Diagnosis present

## 2015-11-06 DIAGNOSIS — Z79899 Other long term (current) drug therapy: Secondary | ICD-10-CM

## 2015-11-06 DIAGNOSIS — Z9010 Acquired absence of unspecified breast and nipple: Secondary | ICD-10-CM | POA: Diagnosis not present

## 2015-11-06 DIAGNOSIS — R609 Edema, unspecified: Secondary | ICD-10-CM | POA: Diagnosis present

## 2015-11-06 DIAGNOSIS — I10 Essential (primary) hypertension: Secondary | ICD-10-CM | POA: Diagnosis present

## 2015-11-06 DIAGNOSIS — Z96659 Presence of unspecified artificial knee joint: Secondary | ICD-10-CM | POA: Diagnosis present

## 2015-11-06 DIAGNOSIS — I639 Cerebral infarction, unspecified: Secondary | ICD-10-CM | POA: Diagnosis not present

## 2015-11-06 HISTORY — DX: Pre-excitation syndrome: I45.6

## 2015-11-06 HISTORY — DX: Essential (primary) hypertension: I10

## 2015-11-06 LAB — ECHOCARDIOGRAM COMPLETE
HEIGHTINCHES: 60 in
WEIGHTICAEL: 1784 [oz_av]

## 2015-11-06 LAB — PROTIME-INR
INR: 1.03
Prothrombin Time: 13.5 seconds (ref 11.4–15.2)

## 2015-11-06 LAB — COMPREHENSIVE METABOLIC PANEL
ALT: 15 U/L (ref 14–54)
ANION GAP: 5 (ref 5–15)
AST: 28 U/L (ref 15–41)
Albumin: 4 g/dL (ref 3.5–5.0)
Alkaline Phosphatase: 50 U/L (ref 38–126)
BILIRUBIN TOTAL: 0.3 mg/dL (ref 0.3–1.2)
BUN: 16 mg/dL (ref 6–20)
CHLORIDE: 101 mmol/L (ref 101–111)
CO2: 26 mmol/L (ref 22–32)
Calcium: 9 mg/dL (ref 8.9–10.3)
Creatinine, Ser: 0.61 mg/dL (ref 0.44–1.00)
Glucose, Bld: 108 mg/dL — ABNORMAL HIGH (ref 65–99)
POTASSIUM: 4.5 mmol/L (ref 3.5–5.1)
Sodium: 132 mmol/L — ABNORMAL LOW (ref 135–145)
TOTAL PROTEIN: 6.4 g/dL — AB (ref 6.5–8.1)

## 2015-11-06 LAB — TSH: TSH: 0.993 u[IU]/mL (ref 0.350–4.500)

## 2015-11-06 LAB — DIFFERENTIAL
BASOS ABS: 0 10*3/uL (ref 0–0.1)
BASOS PCT: 1 %
EOS ABS: 0 10*3/uL (ref 0–0.7)
EOS PCT: 0 %
LYMPHS ABS: 0.8 10*3/uL — AB (ref 1.0–3.6)
Lymphocytes Relative: 20 %
MONO ABS: 0.4 10*3/uL (ref 0.2–0.9)
MONOS PCT: 9 %
NEUTROS ABS: 2.8 10*3/uL (ref 1.4–6.5)
Neutrophils Relative %: 70 %

## 2015-11-06 LAB — APTT: APTT: 31 s (ref 24–36)

## 2015-11-06 LAB — CBC
HEMATOCRIT: 37.4 % (ref 35.0–47.0)
HEMOGLOBIN: 13 g/dL (ref 12.0–16.0)
MCH: 32.9 pg (ref 26.0–34.0)
MCHC: 34.7 g/dL (ref 32.0–36.0)
MCV: 94.6 fL (ref 80.0–100.0)
Platelets: 197 10*3/uL (ref 150–440)
RBC: 3.95 MIL/uL (ref 3.80–5.20)
RDW: 14.6 % — ABNORMAL HIGH (ref 11.5–14.5)
WBC: 4 10*3/uL (ref 3.6–11.0)

## 2015-11-06 LAB — TROPONIN I

## 2015-11-06 LAB — MAGNESIUM: MAGNESIUM: 2.1 mg/dL (ref 1.7–2.4)

## 2015-11-06 LAB — GLUCOSE, CAPILLARY: GLUCOSE-CAPILLARY: 103 mg/dL — AB (ref 65–99)

## 2015-11-06 LAB — MRSA PCR SCREENING: MRSA BY PCR: NEGATIVE

## 2015-11-06 MED ORDER — ONDANSETRON HCL 4 MG PO TABS: 4.0000 mg | ORAL_TABLET | Freq: Four times a day (QID) | ORAL | Status: DC | PRN

## 2015-11-06 MED ORDER — METOPROLOL TARTRATE 50 MG PO TABS
50.0000 mg | ORAL_TABLET | Freq: Every day | ORAL | Status: DC
  Administered 2015-11-07: 11:00:00 50 mg via ORAL
  Filled 2015-11-06: qty 1

## 2015-11-06 MED ORDER — ASPIRIN EC 325 MG PO TBEC
325.0000 mg | DELAYED_RELEASE_TABLET | Freq: Every day | ORAL | Status: DC
  Administered 2015-11-07: 11:00:00 325 mg via ORAL
  Filled 2015-11-06: qty 1

## 2015-11-06 MED ORDER — ONDANSETRON HCL 4 MG/2ML IJ SOLN: 4.0000 mg | Freq: Four times a day (QID) | INTRAMUSCULAR | Status: DC | PRN

## 2015-11-06 MED ORDER — ASPIRIN 81 MG PO CHEW
324.0000 mg | CHEWABLE_TABLET | Freq: Once | ORAL | Status: AC
  Administered 2015-11-06: 324 mg via ORAL
  Filled 2015-11-06: qty 4

## 2015-11-06 MED ORDER — ENOXAPARIN SODIUM 40 MG/0.4ML ~~LOC~~ SOLN
40.0000 mg | SUBCUTANEOUS | Status: DC
  Administered 2015-11-06: 22:00:00 40 mg via SUBCUTANEOUS
  Filled 2015-11-06: qty 0.4

## 2015-11-06 MED ORDER — BENAZEPRIL HCL 20 MG PO TABS
40.0000 mg | ORAL_TABLET | Freq: Every day | ORAL | Status: DC
  Administered 2015-11-07: 11:00:00 40 mg via ORAL
  Filled 2015-11-06 (×2): qty 2

## 2015-11-06 MED ORDER — AMLODIPINE BESYLATE 5 MG PO TABS
5.0000 mg | ORAL_TABLET | Freq: Every day | ORAL | Status: DC
  Administered 2015-11-07: 5 mg via ORAL
  Filled 2015-11-06: qty 1

## 2015-11-06 MED ORDER — ATORVASTATIN CALCIUM 20 MG PO TABS
40.0000 mg | ORAL_TABLET | Freq: Every day | ORAL | Status: DC
  Administered 2015-11-06: 18:00:00 40 mg via ORAL
  Filled 2015-11-06: qty 2

## 2015-11-06 MED ORDER — IBUPROFEN 400 MG PO TABS: 400.0000 mg | ORAL_TABLET | Freq: Four times a day (QID) | ORAL | Status: DC | PRN

## 2015-11-06 MED ORDER — ACETAMINOPHEN 500 MG PO TABS
1000.0000 mg | ORAL_TABLET | Freq: Two times a day (BID) | ORAL | Status: DC
Start: 2015-11-06 — End: 2015-11-07
  Administered 2015-11-06 – 2015-11-07 (×2): 1000 mg via ORAL
  Filled 2015-11-06 (×2): qty 2

## 2015-11-06 MED ORDER — SODIUM CHLORIDE 0.9 % IV SOLN
INTRAVENOUS | Status: DC
  Administered 2015-11-06 – 2015-11-07 (×2): via INTRAVENOUS

## 2015-11-06 MED ORDER — DOCUSATE SODIUM 100 MG PO CAPS
100.0000 mg | ORAL_CAPSULE | Freq: Two times a day (BID) | ORAL | Status: DC
  Administered 2015-11-07: 11:00:00 100 mg via ORAL
  Filled 2015-11-06 (×3): qty 1

## 2015-11-06 MED ORDER — ADULT MULTIVITAMIN W/MINERALS CH
1.0000 | ORAL_TABLET | Freq: Every day | ORAL | Status: DC
  Administered 2015-11-06 – 2015-11-07 (×2): 1 via ORAL
  Filled 2015-11-06 (×2): qty 1

## 2015-11-06 MED ORDER — SODIUM CHLORIDE 0.9% FLUSH
3.0000 mL | Freq: Two times a day (BID) | INTRAVENOUS | Status: DC
  Administered 2015-11-06 – 2015-11-07 (×3): 3 mL via INTRAVENOUS

## 2015-11-06 NOTE — Progress Notes (Signed)
*  PRELIMINARY RESULTS* Echocardiogram 2D Echocardiogram has been performed.  [name] 11/06/2015, 4:20 PM

## 2015-11-06 NOTE — Progress Notes (Signed)
SLP Cancellation Note  Patient Details Name: [name] MRN: [id number] DOB: 1924/01/23   Cancelled treatment:       Reason Eval/Treat Not Completed: Patient at procedure or test/unavailable (ST services will f/u in morning for assessments.)   Nainika Newlun 11/06/2015, 2:50 PM

## 2015-11-06 NOTE — ED Provider Notes (Signed)
[facility] Emergency Department Provider Note  Time seen: 9:11 AM  I have reviewed the triage vital signs and the nursing notes.   HISTORY  Chief Complaint Numbness and Code Stroke    HPI [name] is a 82 y.o. female with a past medical history of hypertension, who presents the emergency department with left-sided deficits. According to the patient she woke this morning at 3:30 AM with a right sided headache, shortly afterwards she developed numbness of her left face and left arm. Patient states she called her son this morning who brought her to the emergency department. Patient states she did not have symptoms when she went to bed last night at 10 PM but awoke at 3:30 AM with a headache and left-sided facial numbness. Patient denies any history of stroke in the past. Patient has had a left mastectomy, and has some issues in the left arm per son with intermittent numbness and weakness, but his never had any facial issues. Patient states the headache is gone, but the numbness remains. She states the numbness is somewhat improved compared to 3:30 this morning.  Past Medical History:  Diagnosis Date  . Hypertension   . WPW (Wolff-Parkinson-White syndrome)     There are no active problems to display for this patient.   Past Surgical History:  Procedure Laterality Date  . MASTECTOMY    . REPLACEMENT TOTAL KNEE      Prior to Admission medications   Not on File    No Known Allergies  History reviewed. No pertinent family history.  Social History Social History  Substance Use Topics  . Smoking status: Never Smoker  . Smokeless tobacco: Never Used  . Alcohol use Not on file    Review of Systems Constitutional: Negative for fever. Cardiovascular: Negative for chest pain. Respiratory: Negative for shortness of breath. Gastrointestinal: Negative for abdominal pain Musculoskeletal: Negative for back pain. Neurological: Negative for  headache 10-point ROS otherwise negative.  ____________________________________________   PHYSICAL EXAM:  VITAL SIGNS: ED Triage Vitals  Enc Vitals Group     BP 11/06/15 0847 (!) 148/105     Pulse Rate 11/06/15 0847 (!) 54     Resp 11/06/15 0847 18     Temp 11/06/15 0847 97.8 F (36.6 C)     Temp Source 11/06/15 0847 Oral     SpO2 11/06/15 0847 98 %     Weight 11/06/15 0848 110 lb (49.9 kg)     Height 11/06/15 0848 5' (1.524 m)     Head Circumference --      Peak Flow --      Pain Score --      Pain Loc --      Pain Edu? --      Excl. in [place]? --     Constitutional: Alert and oriented. Well appearing and in no distress. Eyes: Normal exam ENT   Head: Normocephalic and atraumatic.   Mouth/Throat: Mucous membranes are moist. Cardiovascular: Normal rate, regular rhythm. No murmur Respiratory: Normal respiratory effort without tachypnea nor retractions. Breath sounds are clear  Gastrointestinal: Soft and nontender. No distention.   Musculoskeletal: Nontender with normal range of motion in all extremities. Neurologic:  Normal speech and language. Patient does have a slight left facial droop, states subjective numbness to the left face. She has 4/5 strength in left upper extremity compared to 5/5 in the right upper extremity, with decreased grip strength, however no pronator drift. Patient does state subjective decreased sensation in the  left upper extremity as well. States normal sensation in bilateral lower extremities. Patient has decreased strength but equal bilaterally in the lower extremities. Skin:  Skin is warm, dry and intact.  Psychiatric: Mood and affect are normal.   ____________________________________________    EKG  EKG reviewed and interpreted by myself shows normal sinus rhythm at 69 bpm, occasional PVC, narrow QRS, normal axis, normal intervals, no ST elevation noted.  ____________________________________________    RADIOLOGY  CT head is  negative  ____________________________________________   INITIAL IMPRESSION / ASSESSMENT AND PLAN / ED COURSE  Pertinent labs & imaging results that were available during my care of the patient were reviewed by me and considered in my medical decision making (see chart for details).  Patient presents the emergency department with left-sided deficits. Exam suggestive of likely CVA however his symptoms started overnight the last known normal at 10 PM last night, placing the patient outside of any TPA window. CT head is negative. Neurology is currently seeing the patient. I anticipate likely admission to the hospital for stroke workup.   NIH Stroke Scale   Interval: on arrival Time: 9:15 AM Person Administering Scale: Jacory Kamel  Administer stroke scale items in the order listed. Record performance in each category after each subscale exam. Do not go back and change scores. Follow directions provided for each exam technique. Scores should reflect what the patient does, not what the clinician thinks the patient can do. The clinician should record answers while administering the exam and work quickly. Except where indicated, the patient should not be coached (i.e., repeated requests to patient to make a special effort).   1a  Level of consciousness: 0=alert; keenly responsive  1b. LOC questions:  0=Performs both tasks correctly  1c. LOC commands: 0=Performs both tasks correctly  2.  Best Gaze: 0=normal  3.  Visual: 0=No visual loss  4. Facial Palsy: 1=Minor paralysis (flattened nasolabial fold, asymmetric on smiling)  5a.  Motor left arm: 1=Drift, limb holds 90 (or 45) degrees but drifts down before full 10 seconds: does not hit bed  5b.  Motor right arm: 0=No drift, limb holds 90 (or 45) degrees for full 10 seconds  6a. motor left leg: 0=No drift, limb holds 90 (or 45) degrees for full 10 seconds  6b  Motor right leg:  0=No drift, limb holds 90 (or 45) degrees for full 10 seconds   7. Limb Ataxia: 0=Absent  8.  Sensory: 1=Mild to moderate sensory loss; patient feels pinprick is less sharp or is dull on the affected side; there is a loss of superficial pain with pinprick but patient is aware She is being touched  9. Best Language:  0=No aphasia, normal  10. Dysarthria: 0=Normal  11. Extinction and Inattention: 0=No abnormality  12. Distal motor function: 0=Normal   Total:   3   CT head is negative. I suspect the patient has likely suffered a small stroke. Neurology has seen the patient. We'll admit to the hospital for further workup.  ____________________________________________   FINAL CLINICAL IMPRESSION(S) / ED DIAGNOSES  CVA     Harvest Dark, MD 11/06/15 1023

## 2015-11-06 NOTE — ED Notes (Signed)
EDP at bedside  

## 2015-11-06 NOTE — ED Notes (Signed)
CODE STROKE called to emergency line.

## 2015-11-06 NOTE — Consult Note (Signed)
Referring Physician: Paduchowski    Chief Complaint: Left facial numbness  HPI: VIVIANNA Padilla is an 82 y.o. female with a history of breast cancer who reports that she went to bed at baseline.  She awakened at 0300 this morning and noted left facial numbness.  Symptoms did not resolve and the patient presented for evaluation.  Initial NIHSS of 2.   At baseline the patient has left sided numbness and left upper extremity/bilateral lower extremity weakness.    Date last known well: Date: 11/05/2015 Time last known well: Time: 21:30 tPA Given: No: Outside time window  Past Medical History:  Diagnosis Date  . Hypertension   . WPW (Wolff-Parkinson-White syndrome)     Past Surgical History:  Procedure Laterality Date  . MASTECTOMY    . REPLACEMENT TOTAL KNEE      Family history: Brother with CAD and cancer.  Mother with cancer.  Father with lung cancer.    Social History:  reports that she has never smoked. She has never used smokeless tobacco. No history of illicit drug abuse.  Allergies: No Known Allergies  Medications: I have reviewed the patient's current medications. Prior to Admission:  Prior to Admission medications   Medication Sig Start Date End Date Taking? Authorizing Provider  acetaminophen (TYLENOL) 500 MG tablet Take 1,000 mg by mouth 2 (two) times daily.   Yes Historical Provider, MD  amLODipine (NORVASC) 5 MG tablet Take 5 mg by mouth daily.   Yes Historical Provider, MD  aspirin EC 81 MG tablet Take 81 mg by mouth daily.   Yes Historical Provider, MD  benazepril (LOTENSIN) 40 MG tablet Take 40 mg by mouth daily.   Yes Historical Provider, MD  ibuprofen (ADVIL,MOTRIN) 200 MG tablet Take 400 mg by mouth every 6 (six) hours as needed.   Yes Historical Provider, MD  metoprolol (LOPRESSOR) 50 MG tablet Take 50 mg by mouth daily.   Yes Historical Provider, MD  Multiple Vitamin (MULTIVITAMIN WITH MINERALS) TABS tablet Take 1 tablet by mouth daily.   Yes Historical Provider,  MD    ROS: History obtained from the patient  General ROS: negative for - chills, fatigue, fever, night sweats, weight gain or weight loss Psychological ROS: negative for - behavioral disorder, hallucinations, memory difficulties, mood swings or suicidal ideation Ophthalmic ROS: negative for - blurry vision, double vision, eye pain or loss of vision ENT ROS: negative for - epistaxis, nasal discharge, oral lesions, sore throat, tinnitus or vertigo Allergy and Immunology ROS: negative for - hives or itchy/watery eyes Hematological and Lymphatic ROS: negative for - bleeding problems, bruising or swollen lymph nodes Endocrine ROS: negative for - galactorrhea, hair pattern changes, polydipsia/polyuria or temperature intolerance Respiratory ROS: negative for - cough, hemoptysis, shortness of breath or wheezing Cardiovascular ROS: negative for - chest pain, dyspnea on exertion, edema or irregular heartbeat Gastrointestinal ROS: negative for - abdominal pain, diarrhea, hematemesis, nausea/vomiting or stool incontinence Genito-Urinary ROS: negative for - dysuria, hematuria, incontinence or urinary frequency/urgency Musculoskeletal ROS: left arm and leg swelling Neurological ROS: as noted in HPI, tingling in left arm and left lower extremity Dermatological ROS: negative for rash and skin lesion changes   Physical Examination: Blood pressure (!) 165/64, pulse 67, temperature 97.8 F (36.6 C), temperature source Oral, resp. rate 14, height 5' (1.524 m), weight 49.9 kg (110 lb), SpO2 97 %.  HEENT-  Normocephalic, no lesions, without obvious abnormality.  Normal external eye and conjunctiva.  Normal TM's bilaterally.  Normal auditory canals and external  ears. Normal external nose, mucus membranes and septum.  Normal pharynx. Cardiovascular- S1, S2 normal, pulses palpable throughout   Lungs- chest clear, no wheezing, rales, normal symmetric air entry Abdomen- soft, non-tender; bowel sounds normal; no  masses,  no organomegaly Extremities- BLE edema left greater than right Lymph-no adenopathy palpable Musculoskeletal-no joint tenderness Skin-warm and dry, no hyperpigmentation, vitiligo, or suspicious lesions  Neurological Examination Mental Status: Alert, oriented, thought content appropriate but somewhat tangential.  Speech fluent without evidence of aphasia.  Able to follow 3 step commands without difficulty. Cranial Nerves: II: Discs flat bilaterally; Visual fields grossly normal, pupils equal, round, reactive to light and accommodation III,IV, VI: ptosis not present, extra-ocular motions intact bilaterally V,VII: smile symmetric, facial light touch sensation decreased on the left VIII: hearing normal bilaterally IX,X: gag reflex present XI: bilateral shoulder shrug XII: midline tongue extension Motor: Right : Upper extremity   5/5    Left:     Upper extremity   5-/5  Lower extremity   4/5     Lower extremity   4/5 Tone and bulk:normal tone throughout; no atrophy noted Sensory: Pinprick and light touch decreased in the LUE Deep Tendon Reflexes: 2+ in the upper extremities and trace in the lower extremities Plantars: Right: mute   Left: mute Cerebellar: Normal finger-to-nose and normal heel-to-shin testing bilaterally Gait: not tested due to safety concerns    Laboratory Studies:  Basic Metabolic Panel:  Recent Labs Lab 11/06/15 0856  NA 132*  K 4.5  CL 101  CO2 26  GLUCOSE 108*  BUN 16  CREATININE 0.61  CALCIUM 9.0    Liver Function Tests:  Recent Labs Lab 11/06/15 0856  AST 28  ALT 15  ALKPHOS 50  BILITOT 0.3  PROT 6.4*  ALBUMIN 4.0   No results for input(s): LIPASE, AMYLASE in the last 168 hours. No results for input(s): AMMONIA in the last 168 hours.  CBC:  Recent Labs Lab 11/06/15 0856  WBC 4.0  NEUTROABS 2.8  HGB 13.0  HCT 37.4  MCV 94.6  PLT 197    Cardiac Enzymes:  Recent Labs Lab 11/06/15 0856  TROPONINI <0.03     BNP: Invalid input(s): POCBNP  CBG:  Recent Labs Lab 11/06/15 0902  GLUCAP 103*    Microbiology: No results found for this or any previous visit.  Coagulation Studies:  Recent Labs  11/06/15 0856  LABPROT 13.5  INR 1.03    Urinalysis: No results for input(s): COLORURINE, LABSPEC, PHURINE, GLUCOSEU, HGBUR, BILIRUBINUR, KETONESUR, PROTEINUR, UROBILINOGEN, NITRITE, LEUKOCYTESUR in the last 168 hours.  Invalid input(s): APPERANCEUR  Lipid Panel:    Component Value Date/Time   CHOL 113 07/12/2011 0440   TRIG 47 07/12/2011 0440   HDL 45 07/12/2011 0440   VLDL 9 07/12/2011 0440   LDLCALC 59 07/12/2011 0440    HgbA1C: No results found for: HGBA1C  Urine Drug Screen:  No results found for: LABOPIA, COCAINSCRNUR, LABBENZ, AMPHETMU, THCU, LABBARB  Alcohol Level: No results for input(s): ETH in the last 168 hours.  Other results: EKG: sinus rhythm at 69 bpm.  Imaging: Ct Head Code Stroke W/o Cm  Addendum Date: 11/06/2015   ADDENDUM REPORT: 11/06/2015 09:27 ADDENDUM: Study discussed by telephone with ED provider Beau Fanny on 11/06/2015 At 0910 hours. Electronically Signed   By: Genevie Deepa M.D.   On: 11/06/2015 09:27   Result Date: 11/06/2015 CLINICAL DATA:  Code stroke. 83 year old female left side weakness and elevated blood pressure. Symptoms since 0330 hours. Initial encounter.  EXAM: CT HEAD WITHOUT CONTRAST TECHNIQUE: Contiguous axial images were obtained from the base of the skull through the vertex without intravenous contrast. COMPARISON:  Head CT without contrast 01/20/2012 FINDINGS: Visualized paranasal sinuses and mastoids are stable and well pneumatized. No acute osseous abnormality identified. No acute orbit or scalp soft tissue finding. Calcified atherosclerosis at the skull base. No midline shift, mass effect, or evidence of intracranial mass lesion. No acute intracranial hemorrhage identified. Generalized cerebral volume loss since 2013. No ventriculomegaly.  Mild for age scattered white matter hypodensity. No suspicious intracranial vascular hyperdensity. No cortically based acute infarct identified. ASPECTS [facility] Stroke Program Early CT Score) Total score (0-10 with 10 being normal): 10. There is a small age indeterminate lacune in the left thalamus (series 2, image 14) which would be expected to cause right side weakness. IMPRESSION: 1. No acute cortically based infarct or acute intracranial hemorrhage. 2. ASPECTS is 10. 3. Mild for age white matter hypodensity. Age indeterminate small lacunar infarct in the left thalamus which would result in right side symptoms. Electronically Signed: By: Genevie Kelda M.D. On: 11/06/2015 09:07    Assessment: 82 y.o. female with a history of breast cancer who presented with left facial numbness.  Symptoms have not resolved.  Head CT personally reviewed and shows no acute changes.  Acute infarct remains in the differential.  Patient on ASA as an outpatient.  Further work up recommended.    Stroke Risk Factors - hypertension  Plan: 1. HgbA1c, fasting lipid panel 2. MRI, MRA  of the brain without contrast 3. PT consult, OT consult, Speech consult 4. Echocardiogram 5. Carotid dopplers 6. Prophylactic therapy-Antiplatelet med: Plavix - dose 75mg  daily 7. NPO until RN stroke swallow screen 8. Telemetry monitoring 9. Frequent neuro checks  Case discussed with Dr. Percell Belt, MD Neurology [phone] 11/06/2015, 10:23 AM

## 2015-11-06 NOTE — ED Notes (Signed)
Pt resting in bed, son at bedside, pt awake and alert in no acute distress 

## 2015-11-06 NOTE — ED Triage Notes (Signed)
Pt reports she woke up this am around 0330 with sharp pain behind rt eye and then suddenly had numbness to left side of face and down left arm, numbness in left arm subsided but still has facial numbness.

## 2015-11-06 NOTE — ED Notes (Addendum)
Report to [place], RN at [phone]. Pt to go to floor after done with MRI.  Contacted Scotty with MRI, will call when finished so we can get patient upstairs.

## 2015-11-06 NOTE — ED Notes (Signed)
Pt states she woke up at 0330 when she had to go to the bathroom and noticed a sharp pain behind her right eye. States she noticed left arm numbness and facial numbness, pt states she was last normal at 10 pm when she went to bed, at present pt awake and alert, son at bedside

## 2015-11-06 NOTE — ED Notes (Signed)
Pt to ultrasound

## 2015-11-06 NOTE — ED Notes (Signed)
Pt ambulatory to bathroom

## 2015-11-06 NOTE — ED Notes (Signed)
Dr. Reynolds at bedside.

## 2015-11-06 NOTE — ED Notes (Signed)
Code  Stroke  Called  To [phone] 

## 2015-11-06 NOTE — ED Notes (Signed)
Stroke coordinator at bedside.

## 2015-11-06 NOTE — ED Notes (Signed)
Pt transported to CT via wheel chair with EDT.

## 2015-11-06 NOTE — H&P (Addendum)
[facility] NAME: Via Fansler    MR#:  [id number]  DATE OF BIRTH:  1923-08-11  DATE OF ADMISSION:  11/06/2015  PRIMARY CARE PHYSICIAN: Valera Castle, MD   REQUESTING/REFERRING PHYSICIAN:   CHIEF COMPLAINT:   Chief Complaint  Patient presents with  . Numbness  . Code Stroke    HISTORY OF PRESENT ILLNESS: [name]  is a 82 y.o. female with a known history of Essential hypertension, WPW syndrome, who presents to the hospital with complaints of sharp pain behind the right eye, left face, left arm and leg numbness and weakness. Apparently patient was doing well up until 3:30 AM on the day of admission, when she experienced sudden onset of sharp pain behind her right eye, pain was described as stabbing, lasting couple of minutes. Later, she noted left facial and left arm numbness and some weakness. Patient called a nurse, who checked her blood pressure, which was very elevated at 190/80. Patient's nurse called the patient's son, who picked her up and brought her to primary care physician, Dr. Kym Groom. After hearing about left facial and left arm numbness, primary care physician sent patient to emergency room for evaluation. CT scan of the head without contrast in the emergency room revealed no acute abnormality, small age-indeterminate lacunar infarct in left thalamus. In emergency room, she was noted to have left facial weakness, numbness, which was new, she also was noted to have left sided weakness and numbness, which was reported as old. Hospitalist services were contacted for admission  PAST MEDICAL HISTORY:   Past Medical History:  Diagnosis Date  . Hypertension   . WPW (Wolff-Parkinson-White syndrome)     PAST SURGICAL HISTORY: Past Surgical History:  Procedure Laterality Date  . MASTECTOMY    . REPLACEMENT TOTAL KNEE      SOCIAL HISTORY:  Social History  Substance Use Topics  . Smoking status: Never Smoker  .  Smokeless tobacco: Never Used  . Alcohol use Not on file    FAMILY HISTORY: History reviewed. No pertinent family history.  DRUG ALLERGIES: No Known Allergies  Review of Systems  Constitutional: Negative for chills, fever and weight loss.  HENT: Negative for congestion.   Eyes: Positive for blurred vision and pain. Negative for double vision.  Respiratory: Positive for shortness of breath. Negative for cough, sputum production and wheezing.   Cardiovascular: Positive for chest pain and leg swelling. Negative for palpitations, orthopnea and PND.  Gastrointestinal: Negative for abdominal pain, blood in stool, constipation, diarrhea, nausea and vomiting.  Genitourinary: Positive for frequency and urgency. Negative for dysuria and hematuria.  Musculoskeletal: Positive for back pain. Negative for falls.  Neurological: Positive for sensory change, focal weakness and headaches. Negative for dizziness and tremors.  Endo/Heme/Allergies: Does not bruise/bleed easily.  Psychiatric/Behavioral: Negative for depression. The patient does not have insomnia.     MEDICATIONS AT HOME:  Prior to Admission medications   Medication Sig Start Date End Date Taking? Authorizing Provider  acetaminophen (TYLENOL) 500 MG tablet Take 1,000 mg by mouth 2 (two) times daily.   Yes Historical Provider, MD  amLODipine (NORVASC) 5 MG tablet Take 5 mg by mouth daily.   Yes Historical Provider, MD  aspirin EC 81 MG tablet Take 81 mg by mouth daily.   Yes Historical Provider, MD  benazepril (LOTENSIN) 40 MG tablet Take 40 mg by mouth daily.   Yes Historical Provider, MD  ibuprofen (ADVIL,MOTRIN) 200 MG tablet Take 400  mg by mouth every 6 (six) hours as needed.   Yes Historical Provider, MD  metoprolol (LOPRESSOR) 50 MG tablet Take 50 mg by mouth daily.   Yes Historical Provider, MD  Multiple Vitamin (MULTIVITAMIN WITH MINERALS) TABS tablet Take 1 tablet by mouth daily.   Yes Historical Provider, MD      PHYSICAL  EXAMINATION:   VITAL SIGNS: Blood pressure (!) 164/59, pulse 71, temperature 97.8 F (36.6 C), temperature source Oral, resp. rate 16, height 5' (1.524 m), weight 49.9 kg (110 lb), SpO2 100 %.  GENERAL:  82 y.o.-year-old patient lying in the bed with no acute distress.  EYES: Pupils equal, round, reactive to light and accommodation. No scleral icterus. Extraocular muscles intact.  HEENT: Head atraumatic, normocephalic. Oropharynx and nasopharynx clear.  NECK:  Supple, no jugular venous distention. No thyroid enlargement, no tenderness.  LUNGS: Normal breath sounds bilaterally, no wheezing, rales,rhonchi or crepitation. No use of accessory muscles of respiration.  CARDIOVASCULAR: S1, S2 normal. No murmurs, rubs, or gallops.  ABDOMEN: Soft, nontender, nondistended. Bowel sounds present. No organomegaly or mass.  EXTREMITIES: 1-2  plus lower extremity and pedal edema, mostly on the left side, no cyanosis, or clubbing.  NEUROLOGIC: Cranial nerves II through XII showed mild lower left facial weakness, tongue deviated to the left. Muscle strength 3 /5 in all extremities, mild left-sided weakness. Sensation decreased to light touch on the left side of the body including face, upper and lower extremity. Gait not checked.  PSYCHIATRIC: The patient is alert and oriented x 3. Speech is intact. Demented with impaired memory SKIN: No obvious rash, lesion, or ulcer.   LABORATORY PANEL:   CBC  Recent Labs Lab 11/06/15 0856  WBC 4.0  HGB 13.0  HCT 37.4  PLT 197  MCV 94.6  MCH 32.9  MCHC 34.7  RDW 14.6*  LYMPHSABS 0.8*  MONOABS 0.4  EOSABS 0.0  BASOSABS 0.0   ------------------------------------------------------------------------------------------------------------------  Chemistries   Recent Labs Lab 11/06/15 0856  NA 132*  K 4.5  CL 101  CO2 26  GLUCOSE 108*  BUN 16  CREATININE 0.61  CALCIUM 9.0  AST 28  ALT 15  ALKPHOS 50  BILITOT 0.3    ------------------------------------------------------------------------------------------------------------------  Cardiac Enzymes  Recent Labs Lab 11/06/15 0856  TROPONINI <0.03   ------------------------------------------------------------------------------------------------------------------  RADIOLOGY: Ct Head Code Stroke W/o Cm  Addendum Date: 11/06/2015   ADDENDUM REPORT: 11/06/2015 09:27 ADDENDUM: Study discussed by telephone with ED provider Beau Fanny on 11/06/2015 At 0910 hours. Electronically Signed   By: Genevie Yerania M.D.   On: 11/06/2015 09:27   Result Date: 11/06/2015 CLINICAL DATA:  Code stroke. 82 year old female left side weakness and elevated blood pressure. Symptoms since 0330 hours. Initial encounter. EXAM: CT HEAD WITHOUT CONTRAST TECHNIQUE: Contiguous axial images were obtained from the base of the skull through the vertex without intravenous contrast. COMPARISON:  Head CT without contrast 01/20/2012 FINDINGS: Visualized paranasal sinuses and mastoids are stable and well pneumatized. No acute osseous abnormality identified. No acute orbit or scalp soft tissue finding. Calcified atherosclerosis at the skull base. No midline shift, mass effect, or evidence of intracranial mass lesion. No acute intracranial hemorrhage identified. Generalized cerebral volume loss since 2013. No ventriculomegaly. Mild for age scattered white matter hypodensity. No suspicious intracranial vascular hyperdensity. No cortically based acute infarct identified. ASPECTS [facility] Stroke Program Early CT Score) Total score (0-10 with 10 being normal): 10. There is a small age indeterminate lacune in the left thalamus (series 2, image 14) which would  be expected to cause right side weakness. IMPRESSION: 1. No acute cortically based infarct or acute intracranial hemorrhage. 2. ASPECTS is 10. 3. Mild for age white matter hypodensity. Age indeterminate small lacunar infarct in the left thalamus which would  result in right side symptoms. Electronically Signed: By: Genevie Jayni M.D. On: 11/06/2015 09:07    EKG: Orders placed or performed during the hospital encounter of 11/06/15  . ED EKG  . ED EKG  . EKG 12-Lead  . EKG 12-Lead  . EKG 12-Lead  . EKG 12-Lead  EKG in the emergency room revealed sinus rhythm at 69 bpm, multiple premature complexes, ventricular and supraventricular, left ventricular hypertrophy, old anterior infarct, ST depressions in inferior leads, consider inferior ischemia.   IMPRESSION AND PLAN:  Principal Problem:   CVA (cerebral infarction) Active Problems:   Essential hypertension   Hyponatremia   Hyperglycemia  #1. Stroke with new left facial numbness and weakness, likely chronic left upper and lower extremity weakness, admit patient to medical floor, continue aspirin therapy, add Lipitor, get MRI of the brain, carotid ultrasound, echocardiogram, neurology consultation. Get PT, OT, SLP consultations, initiate patient on dysphagia 1 diet until speech therapist evaluated her, after bedside nursing swallowing testing, if passes #2. Essential hypertension, continue outpatient medications #3. Hyponatremia, questionable mild dehydration, initiate patient on low rate IV fluids, following blood pressure readings closely, check sodium level in the morning #4. Hyperglycemia, get hemoglobin A1c to rule out diabetes #5. Lower extremity swelling, get Doppler ultrasound to rule out DVT #6. Abnormal EKG, intermittent, nonexertional/atypical chest pains, getting echocardiogram, continue metoprolol, aspirin, Lipitor All the records are reviewed and case discussed with ED provider. Management plans discussed with the patient, family and they are in agreement.  CODE STATUS: Code Status History    This patient does not have a recorded code status. Please follow your organizational policy for patients in this situation.    Advance Directive Documentation   Flowsheet Row Most Recent Value   Type of Advance Directive  Healthcare Power of Attorney  Pre-existing out of facility DNR order (yellow form or pink MOST form)  No data  "MOST" Form in Place?  No data       TOTAL TIME TAKING CARE OF THIS PATIENT: 50 minutes.   Discussed with patient's son Theodoro Grist M.D on 11/06/2015 at 11:37 AM  Between 7am to 6pm - Pager - [phone] After 6pm go to [email or web address] - password [email or web address] [facility]  Office  [phone]  CC: Primary care physician; Valera Castle, MD

## 2015-11-07 ENCOUNTER — Inpatient Hospital Stay: Payer: Medicare Other

## 2015-11-07 DIAGNOSIS — G459 Transient cerebral ischemic attack, unspecified: Principal | ICD-10-CM

## 2015-11-07 LAB — BASIC METABOLIC PANEL
Anion gap: 6 (ref 5–15)
BUN: 18 mg/dL (ref 6–20)
CALCIUM: 8.6 mg/dL — AB (ref 8.9–10.3)
CO2: 22 mmol/L (ref 22–32)
Chloride: 109 mmol/L (ref 101–111)
Creatinine, Ser: 0.63 mg/dL (ref 0.44–1.00)
GFR calc Af Amer: >60 mL/min (ref >60)
GLUCOSE: 88 mg/dL (ref 65–99)
POTASSIUM: 3.6 mmol/L (ref 3.5–5.1)
Sodium: 137 mmol/L (ref 135–145)

## 2015-11-07 LAB — LIPID PANEL
Cholesterol: 118 mg/dL (ref 0–200)
HDL: 44 mg/dL (ref >40)
LDL CALC: 64 mg/dL (ref 0–99)
TRIGLYCERIDES: 52 mg/dL (ref <150)
Total CHOL/HDL Ratio: 2.7 RATIO
VLDL: 10 mg/dL (ref 0–40)

## 2015-11-07 LAB — CBC
HEMATOCRIT: 33.2 % — AB (ref 35.0–47.0)
Hemoglobin: 11.8 g/dL — ABNORMAL LOW (ref 12.0–16.0)
MCH: 33.3 pg (ref 26.0–34.0)
MCHC: 35.3 g/dL (ref 32.0–36.0)
MCV: 94.1 fL (ref 80.0–100.0)
Platelets: 175 10*3/uL (ref 150–440)
RBC: 3.53 MIL/uL — ABNORMAL LOW (ref 3.80–5.20)
RDW: 14.6 % — AB (ref 11.5–14.5)
WBC: 3.4 10*3/uL — ABNORMAL LOW (ref 3.6–11.0)

## 2015-11-07 LAB — HEMOGLOBIN A1C
HEMOGLOBIN A1C: 5.3 % (ref 4.8–5.6)
MEAN PLASMA GLUCOSE: 105 mg/dL

## 2015-11-07 MED ORDER — ATORVASTATIN CALCIUM 40 MG PO TABS: 40.0000 mg | ORAL_TABLET | Freq: Every day | ORAL | 0 refills | Status: AC

## 2015-11-07 MED ORDER — CLOPIDOGREL BISULFATE 75 MG PO TABS: 75.0000 mg | ORAL_TABLET | Freq: Every day | ORAL | 0 refills | Status: AC

## 2015-11-07 MED ORDER — CLOPIDOGREL BISULFATE 75 MG PO TABS: 75.0000 mg | ORAL_TABLET | Freq: Every day | ORAL | Status: DC

## 2015-11-07 NOTE — Evaluation (Signed)
Physical Therapy Evaluation Patient Details Name: [name] MRN: [id number] DOB: 08/19/1923 Today's Date: 11/07/2015   History of Present Illness  Pt is an 82 y.o. female presenting after having sharp pain behind R eye (R sided HA) and numbness in L face, arm, and leg.  CT head shows age indeterminate small lacunar infarct L thalamus.  PMH includes L mastectomy (per chart pt still has some issues with L arm with numbness/weakness), TKR, WPW syndrome  Clinical Impression  Prior to admission, pt was modified independent ambulating with rollator.  Pt lives at [facility].  Currently pt is modified independent supine to sit; SBA with transfers; and CGA to SBA with ambulation around nursing loop with RW.  Pt able to turn head R/L/up/down with ambulation but needed to slow down when performing these tasks (using RW for balance).  Pt would benefit from skilled PT to address noted impairments and functional limitations.  Recommend pt discharge to home with HHPT for balance when medically appropriate.     Follow Up Recommendations Home health PT    Equipment Recommendations  Other (comment) (pt already owns rollator)    Recommendations for Other Services       Precautions / Restrictions Precautions Precautions: Fall Restrictions Weight Bearing Restrictions: No      Mobility  Bed Mobility Overal bed mobility: Modified Independent             General bed mobility comments: Supine to sit with HOB elevated  Transfers Overall transfer level: Needs assistance Equipment used: Rolling walker (2 wheeled) Transfers: Sit to/from [facility] Sit to Stand: Supervision Stand pivot transfers: Supervision       General transfer comment: mild increased effort to stand but no loss of balance noted  Ambulation/Gait Ambulation/Gait assistance: Supervision;Min guard Ambulation Distance (Feet): 200 Feet Assistive device: Rolling walker (2 wheeled) Gait  Pattern/deviations: Step-through pattern Gait velocity: decreased   General Gait Details: steady without loss of balance using RW; narrow BOS  Stairs            Wheelchair Mobility    Modified Rankin (Stroke Patients Only)       Balance Overall balance assessment: Needs assistance Sitting-balance support: No upper extremity supported;Feet supported Sitting balance-Leahy Scale: Good     Standing balance support: No upper extremity supported Standing balance-Leahy Scale: Fair Standing balance comment: static standing reaching inside BOS (pt unable to reach outside BOS without UE support)                             Pertinent Vitals/Pain Pain Assessment: No/denies pain  Vitals stable and WFL throughout treatment session (HR and O2).    Home Living Family/patient expects to be discharged to:: Other (Comment)                 Additional Comments: Independent Living at [facility].    Prior Function Level of Independence: Independent with assistive device(s)         Comments: Pt ambulates with rollator with seat.  Independent with bathing/showers and dressing.  Ambulates to dining hall for 3 meals a day provided by facility.  Pt denies any falls in past 6 months.  Pt recently receiving PT for sciatica.     Hand Dominance        Extremity/Trunk Assessment   Upper Extremity Assessment: RUE deficits/detail;LUE deficits/detail RUE Deficits / Details: R UE shoulder flexion, elbow flexion/extension and grip strength all [facility]  LUE Deficits / Details: L UE shoulder flexion 4/5; elbow flexion/extension 4-/5; decreased grip strength compared to R   Lower Extremity Assessment: RLE deficits/detail;LLE deficits/detail RLE Deficits / Details: strength and ROM WFL LLE Deficits / Details: hip flexion, knee flexion/extension, and DF at least 4/5     Communication   Communication: HOH  Cognition Arousal/Alertness: Awake/alert Behavior During Therapy: WFL  for tasks assessed/performed Overall Cognitive Status: Within Functional Limits for tasks assessed                      General Comments General comments (skin integrity, edema, etc.): Pt laying in bed with her son present.    Exercises     Assessment/Plan    PT Assessment Patient needs continued PT services  PT Problem List Decreased balance;Impaired sensation          PT Treatment Interventions DME instruction;Gait training;Functional mobility training;Therapeutic activities;Therapeutic exercise;Balance training;Patient/family education    PT Goals (Current goals can be found in the Care Plan section)  Acute Rehab PT Goals Patient Stated Goal: to go home PT Goal Formulation: With patient/family Time For Goal Achievement: 11/21/15 Potential to Achieve Goals: Good    Frequency Min 2X/week   Barriers to discharge        Co-evaluation               End of Session Equipment Utilized During Treatment: Gait belt Activity Tolerance: Patient tolerated treatment well Patient left: in chair;with call bell/phone within reach;with chair alarm set;with family/visitor present Nurse Communication: Mobility status;Precautions         Time: 1140-1210 PT Time Calculation (min) (ACUTE ONLY): 30 min   Charges:   PT Evaluation $PT Eval Low Complexity: 1 Procedure PT Treatments $Therapeutic Exercise: 8-22 mins   PT G CodesLeitha Bleak 11-10-15, 2:50 PM Leitha Bleak, [place]

## 2015-11-07 NOTE — Progress Notes (Signed)
OT Cancellation Note  Patient Details Name: [name] MRN: [id number] DOB: [birth year]   Cancelled Treatment:    Reason Eval/Treat Not Completed: Patient at procedure or test/ unavailable  Harrel Carina, MS, OTR/L 11/07/2015, 11:35 AM

## 2015-11-07 NOTE — Progress Notes (Signed)
Subjective: Patient awake and alert.  Reports no new neurological complaints.  Back to baseline.    Objective: Current vital signs: BP (!) 158/62 (BP Location: Right Arm)   Pulse 72   Temp 98.7 F (37.1 C) (Oral)   Resp 18   Ht 5' (1.524 m)   Wt 50.6 kg (111 lb 8 oz)   SpO2 97%   BMI 21.78 kg/m  Vital signs in last 24 hours: Temp:  [97.5 F (36.4 C)-98.7 F (37.1 C)] 98.7 F (37.1 C) (09/19 0815) Pulse Rate:  [53-74] 72 (09/19 0815) Resp:  [18-20] 18 (09/18 2012) BP: (131-173)/(45-97) 158/62 (09/19 0815) SpO2:  [97 %-100 %] 97 % (09/19 0815) FiO2 (%):  [21 %] 21 % (09/18 1405) Weight:  [50.6 kg (111 lb 8 oz)] 50.6 kg (111 lb 8 oz) (09/18 1419)  Intake/Output from previous day: 09/18 0701 - 09/19 0700 In: 1303.5 [P.O.:241; I.V.:1062.5] Out: -  Intake/Output this shift: Total I/O In: 240 [P.O.:240] Out: -  Nutritional status: DIET DYS 3 Room service appropriate? Yes with Assist; Fluid consistency: Thin  Neurologic Exam: Mental Status: Alert, oriented, thought content appropriate but somewhat tangential.  Speech fluent without evidence of aphasia.  Able to follow 3 step commands without difficulty. Cranial Nerves: II: Discs flat bilaterally; Visual fields grossly normal, pupils equal, round, reactive to light and accommodation III,IV, VI: ptosis not present, extra-ocular motions intact bilaterally V,VII: smile symmetric, facial light touch sensation intact VIII: hearing normal bilaterally IX,X: gag reflex present XI: bilateral shoulder shrug XII: midline tongue extension Motor: Right :  Upper extremity   5/5                                      Left:     Upper extremity   5-/5             Lower extremity   4/5                                                  Lower extremity   4/5 Tone and bulk:normal tone throughout; no atrophy noted  Lab Results: Basic Metabolic Panel:  Recent Labs Lab 11/06/15 0856 11/06/15 1423 11/07/15 0522  NA 132*  --  137  K 4.5  --   3.6  CL 101  --  109  CO2 26  --  22  GLUCOSE 108*  --  88  BUN 16  --  18  CREATININE 0.61  --  0.63  CALCIUM 9.0  --  8.6*  MG  --  2.1  --     Liver Function Tests:  Recent Labs Lab 11/06/15 0856  AST 28  ALT 15  ALKPHOS 50  BILITOT 0.3  PROT 6.4*  ALBUMIN 4.0   No results for input(s): LIPASE, AMYLASE in the last 168 hours. No results for input(s): AMMONIA in the last 168 hours.  CBC:  Recent Labs Lab 11/06/15 0856 11/07/15 0522  WBC 4.0 3.4*  NEUTROABS 2.8  --   HGB 13.0 11.8*  HCT 37.4 33.2*  MCV 94.6 94.1  PLT 197 175    Cardiac Enzymes:  Recent Labs Lab 11/06/15 0856  TROPONINI <0.03    Lipid Panel:  Recent Labs Lab 11/07/15 0522  CHOL 118  TRIG 52  HDL 44  CHOLHDL 2.7  VLDL 10  LDLCALC 64    CBG:  Recent Labs Lab 11/06/15 0902  GLUCAP 103*    Microbiology: Results for orders placed or performed during the hospital encounter of 11/06/15  MRSA PCR Screening     Status: None   Collection Time: 11/06/15  6:50 PM  Result Value Ref Range Status   MRSA by PCR NEGATIVE NEGATIVE Final    Comment:        The GeneXpert MRSA Assay (FDA approved for NASAL specimens only), is one component of a comprehensive MRSA colonization surveillance program. It is not intended to diagnose MRSA infection nor to guide or monitor treatment for MRSA infections.     Coagulation Studies:  Recent Labs  11/06/15 0856  LABPROT 13.5  INR 1.03    Imaging: Mr Brain Wo Contrast  Result Date: 11/07/2015 CLINICAL DATA:  Sharp pain behind the right eye. Left arm and leg numbness and weakness. EXAM: MRI HEAD WITHOUT CONTRAST TECHNIQUE: Multiplanar, multiecho pulse sequences of the brain and surrounding structures were obtained without intravenous contrast. COMPARISON:  07/26/2010 FINDINGS: Brain: No acute infarction, hemorrhage, hydrocephalus, extra-axial collection or mass lesion. Mild, age congruent small-vessel ischemic change in the  periventricular and deep white matter. Remote lacunar infarct in the left thalamus. Generalized volume loss with mild progression from 2012. Vascular: Normal flow voids. Skull and upper cervical spine: Advanced cervical facet arthropathy with C2-3 and C3-4 anterolisthesis. C3-4 slip measures up to 4 mm. No focal marrow lesion. Sinuses/Orbits: Chronic mucosal thickening or fluid in the right mastoid tip. Other: None. IMPRESSION: Senescent changes without acute finding. Electronically Signed   By: Monte Fantasia M.D.   On: 11/07/2015 10:51   US Carotid Bilateral  Result Date: 11/06/2015 CLINICAL DATA:  TIA, hypertension, syncope and visual disturbance. EXAM: BILATERAL CAROTID DUPLEX ULTRASOUND TECHNIQUE: Pearline Cables scale imaging, color Doppler and duplex ultrasound were performed of bilateral carotid and vertebral arteries in the neck. COMPARISON:  None. FINDINGS: Criteria: Quantification of carotid stenosis is based on velocity parameters that correlate the residual internal carotid diameter with NASCET-based stenosis levels, using the diameter of the distal internal carotid lumen as the denominator for stenosis measurement. The following velocity measurements were obtained: RIGHT ICA:  113/17 cm/sec CCA:  123XX123 cm/sec SYSTOLIC ICA/CCA RATIO:  1.4 DIASTOLIC ICA/CCA RATIO:  1.9 ECA:  73 cm/sec LEFT ICA:  80/12 cm/sec CCA:  0000000 cm/sec SYSTOLIC ICA/CCA RATIO:  1.2 DIASTOLIC ICA/CCA RATIO:  2.0 ECA:  82 cm/sec RIGHT CAROTID ARTERY: There is a mild amount of calcified plaque at the level of the carotid bulb. No evidence of plaque within the ICA or ICA stenosis. RIGHT VERTEBRAL ARTERY: Antegrade flow with normal waveform and velocity. LEFT CAROTID ARTERY: There is a mild amount of calcified plaque in the carotid bulb and extending up to the ICA origin. No evidence of significant ICA stenosis. Estimated left ICA stenosis is less than 50%. LEFT VERTEBRAL ARTERY: Antegrade flow with normal waveform and velocity. IMPRESSION: 1.  Mild amount of plaque at the level of the right carotid bulb. No evidence of right ICA plaque or stenosis. 2. Mild amount of plaque at the level of the left carotid bulb and ICA origin. Estimated left ICA stenosis is less than 50%. Electronically Signed   By: Aletta Edouard M.D.   On: 11/06/2015 14:47   US Venous Img Lower Bilateral  Result Date: 11/06/2015 CLINICAL DATA:  82 year old female with a history of bilateral lower extremity swelling  EXAM: BILATERAL LOWER EXTREMITY VENOUS DOPPLER ULTRASOUND TECHNIQUE: Gray-scale sonography with graded compression, as well as color Doppler and duplex ultrasound were performed to evaluate the lower extremity deep venous systems from the level of the common femoral vein and including the common femoral, femoral, profunda femoral, popliteal and calf veins including the posterior tibial, peroneal and gastrocnemius veins when visible. The superficial great saphenous vein was also interrogated. Spectral Doppler was utilized to evaluate flow at rest and with distal augmentation maneuvers in the common femoral, femoral and popliteal veins. COMPARISON:  09/02/2012, 02/13/2012 FINDINGS: RIGHT LOWER EXTREMITY Common Femoral Vein: No evidence of thrombus. Normal compressibility, respiratory phasicity and response to augmentation. Saphenofemoral Junction: No evidence of thrombus. Normal compressibility and flow on color Doppler imaging. Profunda Femoral Vein: No evidence of thrombus. Normal compressibility and flow on color Doppler imaging. Femoral Vein: No evidence of thrombus. Normal compressibility, respiratory phasicity and response to augmentation. Popliteal Vein: No evidence of thrombus. Normal compressibility, respiratory phasicity and response to augmentation. Calf Veins: No evidence of thrombus. Normal compressibility and flow on color Doppler imaging. Superficial Great Saphenous Vein: No evidence of thrombus. Normal compressibility and flow on color Doppler imaging. Other  Findings:  None. LEFT LOWER EXTREMITY Common Femoral Vein: No evidence of thrombus. Normal compressibility, respiratory phasicity and response to augmentation. Saphenofemoral Junction: No evidence of thrombus. Normal compressibility and flow on color Doppler imaging. Profunda Femoral Vein: No evidence of thrombus. Normal compressibility and flow on color Doppler imaging. Femoral Vein: No evidence of thrombus. Normal compressibility, respiratory phasicity and response to augmentation. Popliteal Vein: No evidence of thrombus. Normal compressibility, respiratory phasicity and response to augmentation. Calf Veins: No evidence of thrombus. Normal compressibility and flow on color Doppler imaging. Superficial Great Saphenous Vein: No evidence of thrombus. Normal compressibility and flow on color Doppler imaging. Other Findings:  None. IMPRESSION: Sonographic survey of the bilateral lower extremities negative for DVT. Signed, Dulcy Fanny. Earleen Newport, DO [facility] Radiology Electronically Signed   By: Corrie Mckusick D.O.   On: 11/06/2015 14:50   Ct Head Code Stroke W/o Cm  Addendum Date: 11/06/2015   ADDENDUM REPORT: 11/06/2015 09:27 ADDENDUM: Study discussed by telephone with ED provider Beau Fanny on 11/06/2015 At 0910 hours. Electronically Signed   By: Genevie Varsha M.D.   On: 11/06/2015 09:27   Result Date: 11/06/2015 CLINICAL DATA:  Code stroke. 82 year old female left side weakness and elevated blood pressure. Symptoms since 0330 hours. Initial encounter. EXAM: CT HEAD WITHOUT CONTRAST TECHNIQUE: Contiguous axial images were obtained from the base of the skull through the vertex without intravenous contrast. COMPARISON:  Head CT without contrast 01/20/2012 FINDINGS: Visualized paranasal sinuses and mastoids are stable and well pneumatized. No acute osseous abnormality identified. No acute orbit or scalp soft tissue finding. Calcified atherosclerosis at the skull base. No  midline shift, mass effect, or evidence of intracranial mass lesion. No acute intracranial hemorrhage identified. Generalized cerebral volume loss since 2013. No ventriculomegaly. Mild for age scattered white matter hypodensity. No suspicious intracranial vascular hyperdensity. No cortically based acute infarct identified. ASPECTS [facility] Stroke Program Early CT Score) Total score (0-10 with 10 being normal): 10. There is a small age indeterminate lacune in the left thalamus (series 2, image 14) which would be expected to cause right side weakness. IMPRESSION: 1. No acute cortically based infarct or acute intracranial hemorrhage. 2. ASPECTS is 10. 3. Mild for age white matter hypodensity. Age indeterminate small lacunar infarct in the left thalamus which would result  in right side symptoms. Electronically Signed: By: Genevie Tuana M.D. On: 11/06/2015 09:07    Medications:  I have reviewed the patient's current medications. Scheduled: . acetaminophen  1,000 mg Oral BID  . amLODipine  5 mg Oral Daily  . atorvastatin  40 mg Oral q1800  . benazepril  40 mg Oral Daily  . [START ON 11/08/2015] clopidogrel  75 mg Oral Daily  . docusate sodium  100 mg Oral BID  . enoxaparin (LOVENOX) injection  40 mg Subcutaneous Q24H  . metoprolol  50 mg Oral Daily  . multivitamin with minerals  1 tablet Oral Daily  . sodium chloride flush  3 mL Intravenous Q12H    Assessment/Plan: Patient back to baseline.  MRI of the brain personally reviewed and shows no acute changes.  TIA likely.  Carotid dopplers show no evidence of hemodynamically significant stenosis.  Echocardiogram shows no cardiac source of emboli with an EF of 55-60%.  A1c 5.3, LDL 64.  Recommendations: 1.  Continue Plavix at 75mg  daily 2.  No further neurologic intervention is recommended at this time.  If further questions arise, please call or page at that time.  Thank you for allowing neurology to participate in the care of this patient.    LOS: 1 day    Alexis Goodell, MD Neurology [phone] 11/07/2015  11:44 AM

## 2015-11-07 NOTE — Progress Notes (Signed)
Pt transported to MRI 

## 2015-11-07 NOTE — Progress Notes (Signed)
Discussed discharge instructions and medications with pt and her son. IV removed. All questions addressed. Pt transported home via car by her son.

## 2015-11-07 NOTE — Discharge Summary (Signed)
[facility] NAME: [name]    MR#:  [id number]  DATE OF BIRTH:  [birth year]  DATE OF ADMISSION:  11/06/2015 ADMITTING PHYSICIAN: Theodoro Grist, MD  DATE OF DISCHARGE: 11/07/2015  1:30 PM  PRIMARY CARE PHYSICIAN: Valera Castle, MD    ADMISSION DIAGNOSIS:  Swelling [R60.9] CVA (cerebral infarction) [I63.9] Cerebral infarction due to unspecified mechanism [I63.9]  DISCHARGE DIAGNOSIS:  Principal Problem:   CVA (cerebral infarction) Active Problems:   Essential hypertension   Hyponatremia   Hyperglycemia   SECONDARY DIAGNOSIS:   Past Medical History:  Diagnosis Date  . Hypertension   . WPW (Wolff-Parkinson-White syndrome)     HOSPITAL COURSE:   1. Transient ischemic attack. MRI of the brain was negative. Carotid ultrasound and echocardiogram unremarkable. Telemetry monitoring unremarkable. Patient was given a step up and treatment with Plavix. High-dose statin prescribed. Patient's symptoms resolved. Home health physical therapy set up. 2. Essential hypertension continue her usual medications 3. Hyponatremia this had improved with IV fluid hydration 4. History of WPW syndrome  DISCHARGE CONDITIONS:   Satisfactory  CONSULTS OBTAINED:  Treatment Team:  Catarina Hartshorn, MD  DRUG ALLERGIES:  No Known Allergies  DISCHARGE MEDICATIONS:   Discharge Medication List as of 11/07/2015 12:27 PM    START taking these medications   Details  atorvastatin (LIPITOR) 40 MG tablet Take 1 tablet (40 mg total) by mouth daily at 6 PM., Starting Tue 11/07/2015, Print    clopidogrel (PLAVIX) 75 MG tablet Take 1 tablet (75 mg total) by mouth daily., Starting Wed 11/08/2015, Print      CONTINUE these medications which have NOT CHANGED   Details  acetaminophen (TYLENOL) 500 MG tablet Take 1,000 mg by mouth 2 (two) times daily., Historical Med    amLODipine (NORVASC) 5 MG tablet Take 5 mg by mouth daily., Historical Med     benazepril (LOTENSIN) 40 MG tablet Take 40 mg by mouth daily., Historical Med    ibuprofen (ADVIL,MOTRIN) 200 MG tablet Take 400 mg by mouth every 6 (six) hours as needed., Historical Med    metoprolol (LOPRESSOR) 50 MG tablet Take 50 mg by mouth daily., Historical Med    Multiple Vitamin (MULTIVITAMIN WITH MINERALS) TABS tablet Take 1 tablet by mouth daily., Historical Med      STOP taking these medications     aspirin EC 81 MG tablet          DISCHARGE INSTRUCTIONS:    Follow-up PMD one week  If you experience worsening of your admission symptoms, develop shortness of breath, life threatening emergency, suicidal or homicidal thoughts you must seek medical attention immediately by calling 911 or calling your MD immediately  if symptoms less severe.  You Must read complete instructions/literature along with all the possible adverse reactions/side effects for all the Medicines you take and that have been prescribed to you. Take any new Medicines after you have completely understood and accept all the possible adverse reactions/side effects.   Please note  You were cared for by a hospitalist during your hospital stay. If you have any questions about your discharge medications or the care you received while you were in the hospital after you are discharged, you can call the unit and asked to speak with the hospitalist on call if the hospitalist that took care of you is not available. Once you are discharged, your primary care physician will handle any further medical issues. Please note that NO REFILLS for any discharge  medications will be authorized once you are discharged, as it is imperative that you return to your primary care physician (or establish a relationship with a primary care physician if you do not have one) for your aftercare needs so that they can reassess your need for medications and monitor your lab values.    Today   CHIEF COMPLAINT:   Chief Complaint  Patient  presents with  . Numbness  . Code Stroke    HISTORY OF PRESENT ILLNESS:  [name]  is a 82 y.o. female with a known history of Presented with numbness and code stroke was called.   VITAL SIGNS:  Blood pressure (!) 158/62, pulse 72, temperature 98.7 F (37.1 C), temperature source Oral, resp. rate 18, height 5' (1.524 m), weight 50.6 kg (111 lb 8 oz), SpO2 97 %.  I/O:   Intake/Output Summary (Last 24 hours) at 11/07/15 1658 Last data filed at 11/07/15 0900  Gross per 24 hour  Intake           1543.5 ml  Output                0 ml  Net           1543.5 ml    PHYSICAL EXAMINATION:  GENERAL:  82 y.o.-year-old patient lying in the bed with no acute distress.  EYES: Pupils equal, round, reactive to light and accommodation. No scleral icterus. Extraocular muscles intact.  HEENT: Head atraumatic, normocephalic. Oropharynx and nasopharynx clear.  NECK:  Supple, no jugular venous distention. No thyroid enlargement, no tenderness.  LUNGS: Normal breath sounds bilaterally, no wheezing, rales,rhonchi or crepitation. No use of accessory muscles of respiration.  CARDIOVASCULAR: S1, S2 normal. No murmurs, rubs, or gallops.  ABDOMEN: Soft, non-tender, non-distended. Bowel sounds present. No organomegaly or mass.  EXTREMITIES: No pedal edema, cyanosis, or clubbing.  NEUROLOGIC: Cranial nerves II through XII are intact. Patient has baseline left-sided weakness. Power was 4+ out of 5 on the left side and 5 out of 5 on the right side. Sensation intact. Gait not checked.  PSYCHIATRIC: The patient is alert and oriented x 3.  SKIN: No obvious rash, lesion, or ulcer.   DATA REVIEW:   CBC  Recent Labs Lab 11/07/15 0522  WBC 3.4*  HGB 11.8*  HCT 33.2*  PLT 175    Chemistries   Recent Labs Lab 11/06/15 0856 11/06/15 1423 11/07/15 0522  NA 132*  --  137  K 4.5  --  3.6  CL 101  --  109  CO2 26  --  22  GLUCOSE 108*  --  88  BUN 16  --  18  CREATININE 0.61  --  0.63  CALCIUM 9.0  --   8.6*  MG  --  2.1  --   AST 28  --   --   ALT 15  --   --   ALKPHOS 50  --   --   BILITOT 0.3  --   --     Cardiac Enzymes  Recent Labs Lab 11/06/15 0856  TROPONINI <0.03    Microbiology Results  Results for orders placed or performed during the hospital encounter of 11/06/15  MRSA PCR Screening     Status: None   Collection Time: 11/06/15  6:50 PM  Result Value Ref Range Status   MRSA by PCR NEGATIVE NEGATIVE Final    Comment:        The GeneXpert MRSA Assay (FDA approved for NASAL specimens only),  is one component of a comprehensive MRSA colonization surveillance program. It is not intended to diagnose MRSA infection nor to guide or monitor treatment for MRSA infections.     RADIOLOGY:  Mr Brain Wo Contrast  Result Date: 11/07/2015 CLINICAL DATA:  Sharp pain behind the right eye. Left arm and leg numbness and weakness. EXAM: MRI HEAD WITHOUT CONTRAST TECHNIQUE: Multiplanar, multiecho pulse sequences of the brain and surrounding structures were obtained without intravenous contrast. COMPARISON:  07/26/2010 FINDINGS: Brain: No acute infarction, hemorrhage, hydrocephalus, extra-axial collection or mass lesion. Mild, age congruent small-vessel ischemic change in the periventricular and deep white matter. Remote lacunar infarct in the left thalamus. Generalized volume loss with mild progression from 2012. Vascular: Normal flow voids. Skull and upper cervical spine: Advanced cervical facet arthropathy with C2-3 and C3-4 anterolisthesis. C3-4 slip measures up to 4 mm. No focal marrow lesion. Sinuses/Orbits: Chronic mucosal thickening or fluid in the right mastoid tip. Other: None. IMPRESSION: Senescent changes without acute finding. Electronically Signed   By: Monte Fantasia M.D.   On: 11/07/2015 10:51   US Carotid Bilateral  Result Date: 11/06/2015 CLINICAL DATA:  TIA, hypertension, syncope and visual disturbance. EXAM: BILATERAL CAROTID DUPLEX ULTRASOUND TECHNIQUE: Pearline Cables scale  imaging, color Doppler and duplex ultrasound were performed of bilateral carotid and vertebral arteries in the neck. COMPARISON:  None. FINDINGS: Criteria: Quantification of carotid stenosis is based on velocity parameters that correlate the residual internal carotid diameter with NASCET-based stenosis levels, using the diameter of the distal internal carotid lumen as the denominator for stenosis measurement. The following velocity measurements were obtained: RIGHT ICA:  113/17 cm/sec CCA:  123XX123 cm/sec SYSTOLIC ICA/CCA RATIO:  1.4 DIASTOLIC ICA/CCA RATIO:  1.9 ECA:  73 cm/sec LEFT ICA:  80/12 cm/sec CCA:  0000000 cm/sec SYSTOLIC ICA/CCA RATIO:  1.2 DIASTOLIC ICA/CCA RATIO:  2.0 ECA:  82 cm/sec RIGHT CAROTID ARTERY: There is a mild amount of calcified plaque at the level of the carotid bulb. No evidence of plaque within the ICA or ICA stenosis. RIGHT VERTEBRAL ARTERY: Antegrade flow with normal waveform and velocity. LEFT CAROTID ARTERY: There is a mild amount of calcified plaque in the carotid bulb and extending up to the ICA origin. No evidence of significant ICA stenosis. Estimated left ICA stenosis is less than 50%. LEFT VERTEBRAL ARTERY: Antegrade flow with normal waveform and velocity. IMPRESSION: 1. Mild amount of plaque at the level of the right carotid bulb. No evidence of right ICA plaque or stenosis. 2. Mild amount of plaque at the level of the left carotid bulb and ICA origin. Estimated left ICA stenosis is less than 50%. Electronically Signed   By: Aletta Edouard M.D.   On: 11/06/2015 14:47   US Venous Img Lower Bilateral  Result Date: 11/06/2015 CLINICAL DATA:  82 year old female with a history of bilateral lower extremity swelling EXAM: BILATERAL LOWER EXTREMITY VENOUS DOPPLER ULTRASOUND TECHNIQUE: Gray-scale sonography with graded compression, as well as color Doppler and duplex ultrasound were performed to evaluate the lower extremity deep venous systems from the level of the common femoral vein  and including the common femoral, femoral, profunda femoral, popliteal and calf veins including the posterior tibial, peroneal and gastrocnemius veins when visible. The superficial great saphenous vein was also interrogated. Spectral Doppler was utilized to evaluate flow at rest and with distal augmentation maneuvers in the common femoral, femoral and popliteal veins. COMPARISON:  09/02/2012, 02/13/2012 FINDINGS: RIGHT LOWER EXTREMITY Common Femoral Vein: No evidence of thrombus. Normal compressibility, respiratory phasicity and response  to augmentation. Saphenofemoral Junction: No evidence of thrombus. Normal compressibility and flow on color Doppler imaging. Profunda Femoral Vein: No evidence of thrombus. Normal compressibility and flow on color Doppler imaging. Femoral Vein: No evidence of thrombus. Normal compressibility, respiratory phasicity and response to augmentation. Popliteal Vein: No evidence of thrombus. Normal compressibility, respiratory phasicity and response to augmentation. Calf Veins: No evidence of thrombus. Normal compressibility and flow on color Doppler imaging. Superficial Great Saphenous Vein: No evidence of thrombus. Normal compressibility and flow on color Doppler imaging. Other Findings:  None. LEFT LOWER EXTREMITY Common Femoral Vein: No evidence of thrombus. Normal compressibility, respiratory phasicity and response to augmentation. Saphenofemoral Junction: No evidence of thrombus. Normal compressibility and flow on color Doppler imaging. Profunda Femoral Vein: No evidence of thrombus. Normal compressibility and flow on color Doppler imaging. Femoral Vein: No evidence of thrombus. Normal compressibility, respiratory phasicity and response to augmentation. Popliteal Vein: No evidence of thrombus. Normal compressibility, respiratory phasicity and response to augmentation. Calf Veins: No evidence of thrombus. Normal compressibility and flow on color Doppler imaging. Superficial Great  Saphenous Vein: No evidence of thrombus. Normal compressibility and flow on color Doppler imaging. Other Findings:  None. IMPRESSION: Sonographic survey of the bilateral lower extremities negative for DVT. Signed, Dulcy Fanny. Earleen Newport, DO [facility] Electronically Signed   By: Corrie Mckusick D.O.   On: 11/06/2015 14:50   Ct Head Code Stroke W/o Cm  Addendum Date: 11/06/2015   ADDENDUM REPORT: 11/06/2015 09:27 ADDENDUM: Study discussed by telephone with ED provider Beau Fanny on 11/06/2015 At 0910 hours. Electronically Signed   By: Genevie Jewels M.D.   On: 11/06/2015 09:27   Result Date: 11/06/2015 CLINICAL DATA:  Code stroke. 82 year old female left side weakness and elevated blood pressure. Symptoms since 0330 hours. Initial encounter. EXAM: CT HEAD WITHOUT CONTRAST TECHNIQUE: Contiguous axial images were obtained from the base of the skull through the vertex without intravenous contrast. COMPARISON:  Head CT without contrast 01/20/2012 FINDINGS: Visualized paranasal sinuses and mastoids are stable and well pneumatized. No acute osseous abnormality identified. No acute orbit or scalp soft tissue finding. Calcified atherosclerosis at the skull base. No midline shift, mass effect, or evidence of intracranial mass lesion. No acute intracranial hemorrhage identified. Generalized cerebral volume loss since 2013. No ventriculomegaly. Mild for age scattered white matter hypodensity. No suspicious intracranial vascular hyperdensity. No cortically based acute infarct identified. ASPECTS [facility] Stroke Program Early CT Score) Total score (0-10 with 10 being normal): 10. There is a small age indeterminate lacune in the left thalamus (series 2, image 14) which would be expected to cause right side weakness. IMPRESSION: 1. No acute cortically based infarct or acute intracranial hemorrhage. 2. ASPECTS is 10. 3. Mild for age white matter hypodensity. Age indeterminate small  lacunar infarct in the left thalamus which would result in right side symptoms. Electronically Signed: By: Genevie Allen M.D. On: 11/06/2015 09:07    Management plans discussed with the patient, family and they are in agreement.  CODE STATUS:  Code Status History    Date Active Date Inactive Code Status Order ID Comments User Context   11/06/2015  2:04 PM 11/06/2015  3:26 PM Full Code [id number]  Theodoro Grist, MD Inpatient    Advance Directive Documentation   Flowsheet Row Most Recent Value  Type of Advance Directive  Healthcare Power of Attorney  Pre-existing out of facility DNR order (yellow form or pink MOST form)  No data  "MOST" Form in Place?  No data      TOTAL TIME TAKING CARE OF THIS PATIENT: 35 minutes.    Loletha Grayer M.D on 11/07/2015 at 4:58 PM  Between 7am to 6pm - Pager - [phone]  After 6pm go to [email or web address] - password [email or web address] [facility]  [phone]  CC: Primary care physician; Valera Castle, MD

## 2015-11-07 NOTE — Care Management Important Message (Signed)
Important Message  Patient Details  Name: HETVI OLDENKAMP MRN: [id number] Date of Birth: 17-Jul-1923   Medicare Important Message Given:  Yes    Shelbie Ammons, RN 11/07/2015, 11:50 AM

## 2015-11-07 NOTE — Care Management (Signed)
Admitted to [facility] with the diagnosis of CVA. Lives at [facility] Primary care physician is listed as Dr. Kym Groom. Physical therapy evaluation completed. Recommends home with home health/physical therapy. Will be followed by [facility].  Family will transport. Discharge to home today per Dr. Leslye Peer. Shelbie Ammons RN MSN [facility] Care Management [phone]

## 2015-11-07 NOTE — Evaluation (Signed)
Clinical/Bedside Swallow Evaluation Patient Details  Name: [name] MRN: [id number] Date of Birth: 02/11/1924  Today's Date: 11/07/2015 Time: SLP Start Time (ACUTE ONLY): 0900 SLP Stop Time (ACUTE ONLY): 1000 SLP Time Calculation (min) (ACUTE ONLY): 60 min  Past Medical History:  Past Medical History:  Diagnosis Date  . Hypertension   . WPW (Wolff-Parkinson-White syndrome)    Past Surgical History:  Past Surgical History:  Procedure Laterality Date  . MASTECTOMY    . REPLACEMENT TOTAL KNEE     HPI:  Pt is a 82 y.o. female with a known history of Essential hypertension, WPW syndrome, who presents to the hospital with complaints of sharp pain behind the right eye, left face, left arm and leg numbness and weakness. Apparently patient was doing well up until 3:30 AM on the day of admission (11/06/15), when she experienced sudden onset of sharp pain behind her right eye, pain was described as stabbing, lasting couple of minutes. Later, she noted left facial and left arm numbness and some weakness. Patient called a nurse, who checked her blood pressure, which was very elevated at 190/80. Patient's nurse called the patient's son, who picked her up and brought her to primary care physician, Dr. Kym Groom. After hearing about left facial and left arm numbness, primary care physician sent patient to emergency room for evaluation. CT scan of the head without contrast in the emergency room revealed no acute abnormality, small age-indeterminate lacunar infarct in left thalamus. In emergency room, she was noted to have left facial weakness, numbness, which was new, she also was noted to have left sided weakness and numbness, which was reported as old. Hospitalist services were contacted for admission   Assessment / Plan / Recommendation Clinical Impression  Pt appeared to adequately tolerate PO trials of thin liquids and multiple trials of solids(pancakes and scrambled eggs) with no immediate overt s/s of  aspiration. Pt appears at reduced risk for aspiration following aspiration precautions. 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 Pt and Son were educated on general esophageal dismotility strategies and aspiration precautions as needed. Recommend Dysphagia 3 diet with thin liquids to ensure safe swallowing. Nursing to reconsult as needed if any change in status.      Aspiration Risk   (Reduced)    Diet Recommendation Dysphagia 3 (Mech soft);Thin liquid, Aspiration Precautions  Liquid Administration via: Cup;Straw Medication Administration: Whole meds with liquid Supervision: Patient able to self feed (Set up assist) Compensations: Minimize environmental distractions;Slow rate;Small sips/bites;Follow solids with liquid Postural Changes: Seated upright at 90 degrees;Remain upright for at least 30 minutes after po intake    Other  Recommendations Recommended Consults: Consider GI evaluation (As Pt desires) Oral Care Recommendations: Oral care BID;Staff/trained caregiver to provide oral care   Follow up Recommendations None      Frequency and Duration            Prognosis Prognosis for Safe Diet Advancement: Good Barriers to Reach Goals:  (None)      Swallow Study   General Date of Onset: 11/06/15 HPI: Pt is a 82 y.o. female with a known history of Essential hypertension, WPW syndrome, who presents to the hospital with complaints of sharp pain behind the right eye, left face, left arm and leg numbness and weakness. Apparently patient was doing well up until 3:30 AM on the day of admission (11/06/15), when she experienced sudden  onset of sharp pain behind her right eye, pain was described as stabbing, lasting couple of minutes. Later, she noted left facial and left arm numbness and some weakness. Patient called a nurse, who checked her blood pressure, which was very elevated at 190/80.  Patient's nurse called the patient's son, who picked her up and brought her to primary care physician, Dr. Kym Groom. After hearing about left facial and left arm numbness, primary care physician sent patient to emergency room for evaluation. CT scan of the head without contrast in the emergency room revealed no acute abnormality, small age-indeterminate lacunar infarct in left thalamus. In emergency room, she was noted to have left facial weakness, numbness, which was new, she also was noted to have left sided weakness and numbness, which was reported as old. Hospitalist services were contacted for admission Type of Study: Bedside Swallow Evaluation Previous Swallow Assessment: None indicated Diet Prior to this Study: Regular;Thin liquids Temperature Spikes Noted: No (WBC 3.4) Respiratory Status: Room air History of Recent Intubation: No Behavior/Cognition: Alert;Cooperative;Pleasant mood Oral Cavity Assessment: Within Functional Limits Oral Care Completed by SLP: Recent completion by staff Oral Cavity - Dentition: Adequate natural dentition Vision: Functional for self-feeding Self-Feeding Abilities: Able to feed self;Needs set up Patient Positioning: Upright in bed Baseline Vocal Quality: Normal Volitional Cough:  (NT) Volitional Swallow: Able to elicit    Oral/Motor/Sensory Function Overall Oral Motor/Sensory Function: Within functional limits   Ice Chips Ice chips: Not tested   Thin Liquid Thin Liquid: Within functional limits Presentation: Straw;Self Fed (4 trials)    Nectar Thick Nectar Thick Liquid: Not tested   Honey Thick Honey Thick Liquid: Not tested   Puree Puree: Not tested   Solid   GO   Solid: Within functional limits Presentation: Self Fed;Spoon (Multiple trials; pancakes and scrambled eggs)        Rivka Safer, B.A. Clinical Graduate Student 11/07/2015,10:03 AM  Orinda Kenner, MS, CCC-SLP

## 2016-01-30 ENCOUNTER — Inpatient Hospital Stay
Admission: EM | Admit: 2016-01-30 | Discharge: 2016-01-31 | DRG: 534 | Disposition: A | Discharge status: Other Acute Inpatient Hospital | Payer: Medicare Other | Attending: Family Medicine | Admitting: Family Medicine

## 2016-01-30 ENCOUNTER — Emergency Department: Payer: Medicare Other

## 2016-01-30 ENCOUNTER — Encounter: Payer: Self-pay | Admitting: *Deleted

## 2016-01-30 DIAGNOSIS — Z803 Family history of malignant neoplasm of breast: Secondary | ICD-10-CM

## 2016-01-30 DIAGNOSIS — S72351A Displaced comminuted fracture of shaft of right femur, initial encounter for closed fracture: Secondary | ICD-10-CM

## 2016-01-30 DIAGNOSIS — Z8673 Personal history of transient ischemic attack (TIA), and cerebral infarction without residual deficits: Secondary | ICD-10-CM | POA: Diagnosis not present

## 2016-01-30 DIAGNOSIS — Z800 Family history of malignant neoplasm of digestive organs: Secondary | ICD-10-CM

## 2016-01-30 DIAGNOSIS — E871 Hypo-osmolality and hyponatremia: Secondary | ICD-10-CM | POA: Diagnosis present

## 2016-01-30 DIAGNOSIS — Z7902 Long term (current) use of antithrombotics/antiplatelets: Secondary | ICD-10-CM

## 2016-01-30 DIAGNOSIS — W19XXXA Unspecified fall, initial encounter: Secondary | ICD-10-CM

## 2016-01-30 DIAGNOSIS — Z8042 Family history of malignant neoplasm of prostate: Secondary | ICD-10-CM

## 2016-01-30 DIAGNOSIS — I10 Essential (primary) hypertension: Secondary | ICD-10-CM | POA: Diagnosis present

## 2016-01-30 DIAGNOSIS — Z807 Family history of other malignant neoplasms of lymphoid, hematopoietic and related tissues: Secondary | ICD-10-CM

## 2016-01-30 DIAGNOSIS — I456 Pre-excitation syndrome: Secondary | ICD-10-CM | POA: Diagnosis present

## 2016-01-30 DIAGNOSIS — Z96659 Presence of unspecified artificial knee joint: Secondary | ICD-10-CM | POA: Diagnosis present

## 2016-01-30 DIAGNOSIS — S72401A Unspecified fracture of lower end of right femur, initial encounter for closed fracture: Secondary | ICD-10-CM | POA: Diagnosis not present

## 2016-01-30 DIAGNOSIS — W1830XA Fall on same level, unspecified, initial encounter: Secondary | ICD-10-CM | POA: Diagnosis present

## 2016-01-30 DIAGNOSIS — Z79899 Other long term (current) drug therapy: Secondary | ICD-10-CM

## 2016-01-30 DIAGNOSIS — E785 Hyperlipidemia, unspecified: Secondary | ICD-10-CM | POA: Diagnosis present

## 2016-01-30 DIAGNOSIS — Z9010 Acquired absence of unspecified breast and nipple: Secondary | ICD-10-CM

## 2016-01-30 DIAGNOSIS — S7290XA Unspecified fracture of unspecified femur, initial encounter for closed fracture: Secondary | ICD-10-CM

## 2016-01-30 DIAGNOSIS — R52 Pain, unspecified: Secondary | ICD-10-CM

## 2016-01-30 DIAGNOSIS — Z801 Family history of malignant neoplasm of trachea, bronchus and lung: Secondary | ICD-10-CM

## 2016-01-30 DIAGNOSIS — I4891 Unspecified atrial fibrillation: Secondary | ICD-10-CM | POA: Diagnosis present

## 2016-01-30 MED ORDER — SODIUM CHLORIDE 0.9 % IV SOLN
Freq: Once | INTRAVENOUS | Status: AC
  Administered 2016-01-31: 01:00:00 via INTRAVENOUS

## 2016-01-30 MED ORDER — FENTANYL CITRATE (PF) 100 MCG/2ML IJ SOLN
25.0000 ug | Freq: Once | INTRAMUSCULAR | Status: AC
  Administered 2016-01-31: 25 ug via INTRAVENOUS
  Filled 2016-01-30: qty 2

## 2016-01-30 MED ORDER — OXYMETAZOLINE HCL 0.05 % NA SOLN: 6.0000 | Freq: Once | NASAL | Status: DC

## 2016-01-30 NOTE — ED Provider Notes (Signed)
[facility] Emergency Department Provider Note  ____________________________________________  Time seen: Approximately 11:00 PM  I have reviewed the triage vital signs and the nursing notes.   HISTORY  Chief Complaint Fall and Leg Pain   HPI [name] is a 82 y.o. female h/o CVA on Plavix, WPW, HTN who presents for evaluation of R leg pain s/p mechanical fall. Patient reports that she was getting something in her closet when her foot slipped and she fell onto her right side. She denies head trauma or LOC. She is complaining of pain in her right leg, she has been unable to stand up or bear weight. Her pain is mild at rest and severe with any movement of the leg, constant, throbbing and sharp, located in the right distal femur area, and non-radiating. Patient denies chest pain, shortness of breath, headache, palpitations, neck pain, abdominal pain, back pain, extremity pain, dizziness both preceding or after the fall.  Past Medical History:  Diagnosis Date  . Hypertension   . WPW (Wolff-Parkinson-White syndrome)     Patient Active Problem List   Diagnosis Date Noted  . Closed fracture of right distal femur ([place]) 01/31/2016  . HLD (hyperlipidemia) 01/31/2016  . Normocytic normochromic anemia 01/31/2016  . History of stroke 01/31/2016  . CVA (cerebral infarction) 11/06/2015  . Essential hypertension 11/06/2015  . Hyponatremia 11/06/2015  . Hyperglycemia 11/06/2015    Past Surgical History:  Procedure Laterality Date  . MASTECTOMY    . REPLACEMENT TOTAL KNEE      Prior to Admission medications   Medication Sig Start Date End Date Taking? Authorizing Provider  acetaminophen (TYLENOL) 500 MG tablet Take 1,000 mg by mouth 2 (two) times daily as needed for moderate pain.    Yes Historical Provider, MD  amLODipine (NORVASC) 5 MG tablet Take 5 mg by mouth daily.   Yes Historical Provider, MD  atorvastatin (LIPITOR) 40 MG tablet Take 1 tablet (40 mg  total) by mouth daily at 6 PM. 11/07/15  Yes Loletha Grayer, MD  benazepril (LOTENSIN) 40 MG tablet Take 40 mg by mouth daily.   Yes Historical Provider, MD  clopidogrel (PLAVIX) 75 MG tablet Take 1 tablet (75 mg total) by mouth daily. 11/08/15  Yes Loletha Grayer, MD  ibuprofen (ADVIL,MOTRIN) 200 MG tablet Take 400 mg by mouth every 6 (six) hours as needed for moderate pain.    Yes Historical Provider, MD  Multiple Vitamin (MULTIVITAMIN WITH MINERALS) TABS tablet Take 1 tablet by mouth daily.   Yes Historical Provider, MD  metoprolol succinate (TOPROL-XL) 50 MG 24 hr tablet Take 50 mg by mouth daily. Take with or immediately following a meal.    Historical Provider, MD  tolterodine (DETROL) 1 MG tablet Take 1 mg by mouth 2 (two) times daily.    Historical Provider, MD    Allergies Patient has no known allergies.  Family History  Problem Relation Age of Onset  . Lymphoma Brother   . CAD Brother   . Prostate cancer Brother   . Cancer Mother   . Cancer Father     Social History Social History  Substance Use Topics  . Smoking status: Never Smoker  . Smokeless tobacco: Never Used  . Alcohol use Not on file    Review of Systems Constitutional: Negative for fever. Eyes: Negative for visual changes. ENT: Negative for facial injury or neck injury Cardiovascular: Negative for chest injury. Respiratory: Negative for shortness of breath. Negative for chest wall injury. Gastrointestinal: Negative for  abdominal pain or injury. Genitourinary: Negative for dysuria. Musculoskeletal: Negative for back injury, + R leg pain. Skin: Negative for laceration/abrasions. Neurological: Negative for head injury.   ____________________________________________   PHYSICAL EXAM:  VITAL SIGNS: ED Triage Vitals  Enc Vitals Group     BP 01/30/16 2148 (!) 205/84     Pulse Rate 01/30/16 2148 99     Resp 01/30/16 2148 19     Temp 01/30/16 2148 97.6 F (36.4 C)     Temp Source 01/30/16 2148 Oral      SpO2 01/30/16 2148 99 %     Weight 01/30/16 2150 106 lb (48.1 kg)     Height 01/30/16 2150 4\' 9"  (1.448 m)     Head Circumference --      Peak Flow --      Pain Score 01/30/16 2151 7     Pain Loc --      Pain Edu? --      Excl. in [place]? --    Full spinal precautions maintained throughout the trauma exam. Constitutional: Alert and oriented. No acute distress. Does not appear intoxicated. HEENT Head: Normocephalic and atraumatic. Face: No facial bony tenderness. Stable midface Ears: No hemotympanum bilaterally. No Battle sign Eyes: No eye injury. PERRL. No raccoon eyes Nose: Nontender. No epistaxis. No rhinorrhea Mouth/Throat: Mucous membranes are moist. No oropharyngeal blood. No dental injury. Airway patent without stridor. Normal voice. Neck: no C-collar in place. No midline c-spine tenderness.  Cardiovascular: Normal rate, regular rhythm. Normal and symmetric distal pulses are present in all extremities. Pulmonary/Chest: Chest wall is stable and nontender to palpation/compression. Normal respiratory effort. Breath sounds are normal. No crepitus.  Abdominal: Soft, nontender, non distended. Musculoskeletal: Obvious deformity of the R femur with severe ttp. Nontender with normal full range of motion in all other extremities. No deformities. No thoracic or lumbar midline spinal tenderness. Pelvis is stable. Skin: Skin is warm, dry and intact. No abrasions or contutions. Psychiatric: Speech and behavior are appropriate. Neurological: Normal speech and language. Moves all extremities to command. No gross focal neurologic deficits are appreciated.  Glascow Coma Score: 4 - Opens eyes on own 6 - Follows simple motor commands 5 - Alert and oriented GCS: 15  ____________________________________________   LABS (all labs ordered are listed, but only abnormal results are displayed)  Labs Reviewed  BASIC METABOLIC PANEL - Abnormal; Notable for the following:       Result Value   Sodium  128 (*)    Chloride 98 (*)    Glucose, Bld 162 (*)    BUN 29 (*)    Calcium 8.5 (*)    All other components within normal limits  CBC - Abnormal; Notable for the following:    WBC 13.6 (*)    RBC 3.01 (*)    Hemoglobin 9.8 (*)    HCT 28.3 (*)    All other components within normal limits  URINALYSIS, COMPLETE (UACMP) WITH MICROSCOPIC - Abnormal; Notable for the following:    Color, Urine YELLOW (*)    APPearance CLEAR (*)    Ketones, ur 5 (*)    Protein, ur 30 (*)    All other components within normal limits  PROTIME-INR  APTT   ____________________________________________  EKG  ED ECG REPORT I, Rudene Re, the attending physician, personally viewed and interpreted this ECG.  H a fibrillation, rate of 92, normal QTC, normal axis, right bundle branch block, no ST elevations or depressions, T-wave inversions on anterior leads. Unchanged from prior  ____________________________________________  RADIOLOGY  Head CT: negative  XR R femur:  Comminuted and displaced oblique fracture of the distal femur with extension of the fracture to the femoral component of the arthroplasty. Orthopedic consult is advised.  XR pelvis: negative  CXR: No acute intracranial process. Stable examination including old LEFT thalamus lacunar infarct and mild to moderate chronic small vessel ischemic disease. ____________________________________________   PROCEDURES  Procedure(s) performed: None Procedures Critical Care performed:  CRITICAL CARE Performed by: Rudene Re  ?  Total critical care time: 35 min  Critical care time was exclusive of separately billable procedures and treating other patients.  Critical care was necessary to treat or prevent imminent or life-threatening deterioration.  Critical care was time spent personally by me on the following activities: development of treatment plan with patient and/or surrogate as well as nursing, discussions with consultants,  evaluation of patient's response to treatment, examination of patient, obtaining history from patient or surrogate, ordering and performing treatments and interventions, ordering and review of laboratory studies, ordering and review of radiographic studies, pulse oximetry and re-evaluation of patient's condition.  ____________________________________________   INITIAL IMPRESSION / ASSESSMENT AND PLAN / ED COURSE   82 y.o. female h/o CVA on Plavix, WPW, HTN who presents for evaluation of R leg pain s/p mechanical fall. Patient found to have a comminuted and displaced oblique fracture of the distal femur, no evidence of compartment syndrome. No other injuries seen on exam or imaging. I spoke with Dr. Roland Rack who recommended transferring patient for surgery. Patient accepted to [facility] by Orthopedics and Hospitalist service. CT knee and femur has been ordered at the request of Ortho, patient placed on knee immobilizer. Foley catheter, basic labs, and EKG ordered.  Clinical Course     Pertinent labs & imaging results that were available during my care of the patient were reviewed by me and considered in my medical decision making (see chart for details).    ____________________________________________   FINAL CLINICAL IMPRESSION(S) / ED DIAGNOSES  Final diagnoses:  Femur fracture ([place])  Closed displaced comminuted fracture of shaft of right femur, initial encounter ([place])  Fall, initial encounter      NEW MEDICATIONS STARTED DURING THIS VISIT:  Discharge Medication List as of 01/31/2016  1:34 AM       Note:  This document was prepared using Dragon voice recognition software and may include unintentional dictation errors.    Rudene Re, MD 01/31/16 1031

## 2016-01-30 NOTE — ED Triage Notes (Signed)
Pt lives in [place]. Pt normally A&O x4 and was when EMS arrived. Pt had unwitnessed fall in bathroom, complain R leg pain. Pt has hx of R knee replacement. EMS report swelling and pain in distal R thigh. Pt has palpable pulses distal to R leg injury, and shortening and inward rotation to R leg. Pt reported to EMS she slipped on wet floor. Pt has PIV 20# ga started by EMS. Pt was administered fentanyl 100 mcg en route by EMS and has some AMS at this time. Pt denies other injury and LoC at this time. No respiratory distress. Hx of HTN. No updated med list.

## 2016-01-31 ENCOUNTER — Encounter (HOSPITAL_COMMUNITY): Payer: Self-pay | Admitting: Internal Medicine

## 2016-01-31 ENCOUNTER — Inpatient Hospital Stay (HOSPITAL_COMMUNITY): Payer: Medicare Other

## 2016-01-31 ENCOUNTER — Encounter (HOSPITAL_COMMUNITY)
Admission: EM | Disposition: A | Discharge status: Skilled Nursing Facility | Payer: Self-pay | Source: Other Acute Inpatient Hospital | Attending: Internal Medicine

## 2016-01-31 ENCOUNTER — Inpatient Hospital Stay: Admit: 2016-01-31 | Payer: Medicare Other | Admitting: Orthopedic Surgery

## 2016-01-31 ENCOUNTER — Inpatient Hospital Stay (HOSPITAL_COMMUNITY): Payer: Medicare Other | Admitting: Anesthesiology

## 2016-01-31 ENCOUNTER — Inpatient Hospital Stay (HOSPITAL_COMMUNITY)
Admission: EM | Admit: 2016-01-31 | Discharge: 2016-02-05 | DRG: 481 | Disposition: A | Discharge status: Skilled Nursing Facility | Payer: Medicare Other | Source: Other Acute Inpatient Hospital | Attending: Family Medicine | Admitting: Family Medicine

## 2016-01-31 DIAGNOSIS — Z09 Encounter for follow-up examination after completed treatment for conditions other than malignant neoplasm: Secondary | ICD-10-CM

## 2016-01-31 DIAGNOSIS — S72401A Unspecified fracture of lower end of right femur, initial encounter for closed fracture: Secondary | ICD-10-CM | POA: Diagnosis not present

## 2016-01-31 DIAGNOSIS — Z8673 Personal history of transient ischemic attack (TIA), and cerebral infarction without residual deficits: Secondary | ICD-10-CM

## 2016-01-31 DIAGNOSIS — I4891 Unspecified atrial fibrillation: Secondary | ICD-10-CM | POA: Diagnosis present

## 2016-01-31 DIAGNOSIS — I10 Essential (primary) hypertension: Secondary | ICD-10-CM | POA: Diagnosis not present

## 2016-01-31 DIAGNOSIS — Z807 Family history of other malignant neoplasms of lymphoid, hematopoietic and related tissues: Secondary | ICD-10-CM | POA: Diagnosis not present

## 2016-01-31 DIAGNOSIS — Z7902 Long term (current) use of antithrombotics/antiplatelets: Secondary | ICD-10-CM | POA: Diagnosis not present

## 2016-01-31 DIAGNOSIS — Y92121 Bathroom in nursing home as the place of occurrence of the external cause: Secondary | ICD-10-CM | POA: Diagnosis not present

## 2016-01-31 DIAGNOSIS — R262 Difficulty in walking, not elsewhere classified: Secondary | ICD-10-CM

## 2016-01-31 DIAGNOSIS — Z803 Family history of malignant neoplasm of breast: Secondary | ICD-10-CM | POA: Diagnosis not present

## 2016-01-31 DIAGNOSIS — S72009A Fracture of unspecified part of neck of unspecified femur, initial encounter for closed fracture: Secondary | ICD-10-CM | POA: Diagnosis present

## 2016-01-31 DIAGNOSIS — Z9012 Acquired absence of left breast and nipple: Secondary | ICD-10-CM | POA: Diagnosis not present

## 2016-01-31 DIAGNOSIS — Z9010 Acquired absence of unspecified breast and nipple: Secondary | ICD-10-CM | POA: Diagnosis not present

## 2016-01-31 DIAGNOSIS — S72401D Unspecified fracture of lower end of right femur, subsequent encounter for closed fracture with routine healing: Secondary | ICD-10-CM | POA: Diagnosis not present

## 2016-01-31 DIAGNOSIS — W010XXA Fall on same level from slipping, tripping and stumbling without subsequent striking against object, initial encounter: Secondary | ICD-10-CM | POA: Diagnosis not present

## 2016-01-31 DIAGNOSIS — S72001D Fracture of unspecified part of neck of right femur, subsequent encounter for closed fracture with routine healing: Secondary | ICD-10-CM | POA: Diagnosis not present

## 2016-01-31 DIAGNOSIS — M978XXA Periprosthetic fracture around other internal prosthetic joint, initial encounter: Secondary | ICD-10-CM

## 2016-01-31 DIAGNOSIS — Z96659 Presence of unspecified artificial knee joint: Secondary | ICD-10-CM | POA: Diagnosis present

## 2016-01-31 DIAGNOSIS — I119 Hypertensive heart disease without heart failure: Secondary | ICD-10-CM | POA: Diagnosis present

## 2016-01-31 DIAGNOSIS — R5082 Postprocedural fever: Secondary | ICD-10-CM | POA: Diagnosis present

## 2016-01-31 DIAGNOSIS — W1830XA Fall on same level, unspecified, initial encounter: Secondary | ICD-10-CM | POA: Diagnosis present

## 2016-01-31 DIAGNOSIS — R509 Fever, unspecified: Secondary | ICD-10-CM

## 2016-01-31 DIAGNOSIS — D649 Anemia, unspecified: Secondary | ICD-10-CM | POA: Diagnosis present

## 2016-01-31 DIAGNOSIS — I456 Pre-excitation syndrome: Secondary | ICD-10-CM | POA: Diagnosis present

## 2016-01-31 DIAGNOSIS — R0602 Shortness of breath: Secondary | ICD-10-CM

## 2016-01-31 DIAGNOSIS — M9711XA Periprosthetic fracture around internal prosthetic right knee joint, initial encounter: Principal | ICD-10-CM | POA: Diagnosis present

## 2016-01-31 DIAGNOSIS — E785 Hyperlipidemia, unspecified: Secondary | ICD-10-CM | POA: Diagnosis present

## 2016-01-31 DIAGNOSIS — Z8249 Family history of ischemic heart disease and other diseases of the circulatory system: Secondary | ICD-10-CM | POA: Diagnosis not present

## 2016-01-31 DIAGNOSIS — Z66 Do not resuscitate: Secondary | ICD-10-CM | POA: Diagnosis present

## 2016-01-31 DIAGNOSIS — E871 Hypo-osmolality and hyponatremia: Secondary | ICD-10-CM | POA: Diagnosis present

## 2016-01-31 DIAGNOSIS — Z79899 Other long term (current) drug therapy: Secondary | ICD-10-CM

## 2016-01-31 DIAGNOSIS — Z8042 Family history of malignant neoplasm of prostate: Secondary | ICD-10-CM | POA: Diagnosis not present

## 2016-01-31 DIAGNOSIS — Z801 Family history of malignant neoplasm of trachea, bronchus and lung: Secondary | ICD-10-CM | POA: Diagnosis not present

## 2016-01-31 DIAGNOSIS — Z800 Family history of malignant neoplasm of digestive organs: Secondary | ICD-10-CM | POA: Diagnosis not present

## 2016-01-31 HISTORY — PX: ORIF FEMUR FRACTURE: SHX2119

## 2016-01-31 LAB — PREPARE RBC (CROSSMATCH)

## 2016-01-31 LAB — URINALYSIS, COMPLETE (UACMP) WITH MICROSCOPIC
Bacteria, UA: NONE SEEN
Bilirubin Urine: NEGATIVE
GLUCOSE, UA: NEGATIVE mg/dL
HGB URINE DIPSTICK: NEGATIVE
Ketones, ur: 5 mg/dL — AB
Leukocytes, UA: NEGATIVE
NITRITE: NEGATIVE
Protein, ur: 30 mg/dL — AB
SPECIFIC GRAVITY, URINE: 1.021 (ref 1.005–1.030)
Squamous Epithelial / LPF: NONE SEEN
pH: 6 (ref 5.0–8.0)

## 2016-01-31 LAB — BASIC METABOLIC PANEL
ANION GAP: 5 (ref 5–15)
Anion gap: 7 (ref 5–15)
BUN: 28 mg/dL — AB (ref 6–20)
BUN: 29 mg/dL — AB (ref 6–20)
CALCIUM: 8.5 mg/dL — AB (ref 8.9–10.3)
CHLORIDE: 101 mmol/L (ref 101–111)
CO2: 23 mmol/L (ref 22–32)
CO2: 25 mmol/L (ref 22–32)
CREATININE: 0.91 mg/dL (ref 0.44–1.00)
Calcium: 8.7 mg/dL — ABNORMAL LOW (ref 8.9–10.3)
Chloride: 98 mmol/L — ABNORMAL LOW (ref 101–111)
Creatinine, Ser: 0.78 mg/dL (ref 0.44–1.00)
GFR calc Af Amer: >60 mL/min (ref >60)
GFR calc Af Amer: >60 mL/min (ref >60)
GFR calc non Af Amer: 54 mL/min — ABNORMAL LOW (ref >60)
GLUCOSE: 157 mg/dL — AB (ref 65–99)
GLUCOSE: 162 mg/dL — AB (ref 65–99)
Potassium: 4.5 mmol/L (ref 3.5–5.1)
Potassium: 4.6 mmol/L (ref 3.5–5.1)
Sodium: 128 mmol/L — ABNORMAL LOW (ref 135–145)
Sodium: 131 mmol/L — ABNORMAL LOW (ref 135–145)

## 2016-01-31 LAB — CBC
HCT: 28.3 % — ABNORMAL LOW (ref 35.0–47.0)
HEMATOCRIT: 23.3 % — AB (ref 36.0–46.0)
Hemoglobin: 8 g/dL — ABNORMAL LOW (ref 12.0–15.0)
Hemoglobin: 9.8 g/dL — ABNORMAL LOW (ref 12.0–16.0)
MCH: 32 pg (ref 26.0–34.0)
MCH: 32.5 pg (ref 26.0–34.0)
MCHC: 34.3 g/dL (ref 30.0–36.0)
MCHC: 34.5 g/dL (ref 32.0–36.0)
MCV: 93.2 fL (ref 78.0–100.0)
MCV: 94.2 fL (ref 80.0–100.0)
PLATELETS: 158 10*3/uL (ref 150–400)
PLATELETS: 227 10*3/uL (ref 150–440)
RBC: 2.5 MIL/uL — ABNORMAL LOW (ref 3.87–5.11)
RBC: 3.01 MIL/uL — ABNORMAL LOW (ref 3.80–5.20)
RDW: 14.2 % (ref 11.5–15.5)
RDW: 14 % (ref 11.5–14.5)
WBC: 10.1 10*3/uL (ref 4.0–10.5)
WBC: 13.6 10*3/uL — ABNORMAL HIGH (ref 3.6–11.0)

## 2016-01-31 LAB — APTT: aPTT: 26 seconds (ref 24–36)

## 2016-01-31 LAB — PROTIME-INR
INR: 1.13
PROTHROMBIN TIME: 14.6 s (ref 11.4–15.2)

## 2016-01-31 LAB — MRSA PCR SCREENING: MRSA by PCR: NEGATIVE

## 2016-01-31 LAB — ABO/RH: ABO/RH(D): O POS

## 2016-01-31 SURGERY — OPEN REDUCTION INTERNAL FIXATION (ORIF) DISTAL FEMUR FRACTURE
Anesthesia: General | Site: Leg Lower | Laterality: Right

## 2016-01-31 MED ORDER — ENSURE ENLIVE PO LIQD: 237.0000 mL | Freq: Every day | ORAL | Status: DC

## 2016-01-31 MED ORDER — LACTATED RINGERS IV SOLN
INTRAVENOUS | Status: DC | PRN
  Administered 2016-01-31 (×2): via INTRAVENOUS

## 2016-01-31 MED ORDER — ACETAMINOPHEN 325 MG PO TABS
650.0000 mg | ORAL_TABLET | Freq: Four times a day (QID) | ORAL | Status: DC | PRN
  Administered 2016-02-02 – 2016-02-04 (×5): 650 mg via ORAL
  Filled 2016-01-31 (×5): qty 2

## 2016-01-31 MED ORDER — FENTANYL CITRATE (PF) 100 MCG/2ML IJ SOLN
INTRAMUSCULAR | Status: DC | PRN
  Administered 2016-01-31 (×2): 50 ug via INTRAVENOUS

## 2016-01-31 MED ORDER — CEFAZOLIN SODIUM-DEXTROSE 2-4 GM/100ML-% IV SOLN: 2.0000 g | INTRAVENOUS | Status: DC

## 2016-01-31 MED ORDER — SUCCINYLCHOLINE CHLORIDE 200 MG/10ML IV SOSY
PREFILLED_SYRINGE | INTRAVENOUS | Status: DC | PRN
  Administered 2016-01-31: 80 mg via INTRAVENOUS

## 2016-01-31 MED ORDER — CEFAZOLIN SODIUM-DEXTROSE 2-3 GM-% IV SOLR
INTRAVENOUS | Status: DC | PRN
  Administered 2016-01-31: 2 g via INTRAVENOUS

## 2016-01-31 MED ORDER — PROPOFOL 10 MG/ML IV BOLUS
INTRAVENOUS | Status: AC
  Filled 2016-01-31: qty 20

## 2016-01-31 MED ORDER — PHENYLEPHRINE HCL 10 MG/ML IJ SOLN
INTRAVENOUS | Status: DC | PRN
  Administered 2016-01-31: 20 ug/min via INTRAVENOUS

## 2016-01-31 MED ORDER — CEFAZOLIN SODIUM-DEXTROSE 2-4 GM/100ML-% IV SOLN
INTRAVENOUS | Status: AC
Start: 2016-01-31 — End: 2016-02-01
  Filled 2016-01-31: qty 100

## 2016-01-31 MED ORDER — METHOCARBAMOL 500 MG PO TABS
500.0000 mg | ORAL_TABLET | Freq: Four times a day (QID) | ORAL | Status: DC | PRN
  Administered 2016-02-02 – 2016-02-04 (×2): 500 mg via ORAL
  Filled 2016-01-31 (×2): qty 1

## 2016-01-31 MED ORDER — LIDOCAINE 2% (20 MG/ML) 5 ML SYRINGE
INTRAMUSCULAR | Status: AC
  Filled 2016-01-31: qty 5

## 2016-01-31 MED ORDER — ACETAMINOPHEN 650 MG RE SUPP: 650.0000 mg | Freq: Four times a day (QID) | RECTAL | Status: DC | PRN

## 2016-01-31 MED ORDER — ONDANSETRON HCL 4 MG/2ML IJ SOLN
INTRAMUSCULAR | Status: AC
  Filled 2016-01-31: qty 2

## 2016-01-31 MED ORDER — MORPHINE SULFATE (PF) 2 MG/ML IV SOLN: 0.5000 mg | INTRAVENOUS | Status: DC | PRN

## 2016-01-31 MED ORDER — 0.9 % SODIUM CHLORIDE (POUR BTL) OPTIME
TOPICAL | Status: DC | PRN
  Administered 2016-01-31: 1000 mL

## 2016-01-31 MED ORDER — SODIUM CHLORIDE 0.9 % IV SOLN
INTRAVENOUS | Status: DC
  Administered 2016-01-31: 75 mL/h via INTRAVENOUS
  Administered 2016-02-01: 09:00:00 via INTRAVENOUS

## 2016-01-31 MED ORDER — ROCURONIUM BROMIDE 10 MG/ML (PF) SYRINGE
PREFILLED_SYRINGE | INTRAVENOUS | Status: DC | PRN
  Administered 2016-01-31: 30 mg via INTRAVENOUS

## 2016-01-31 MED ORDER — ONDANSETRON HCL 4 MG/2ML IJ SOLN
INTRAMUSCULAR | Status: DC | PRN
  Administered 2016-01-31: 4 mg via INTRAVENOUS

## 2016-01-31 MED ORDER — FENTANYL CITRATE (PF) 100 MCG/2ML IJ SOLN
INTRAMUSCULAR | Status: AC
  Filled 2016-01-31: qty 4

## 2016-01-31 MED ORDER — CHLORHEXIDINE GLUCONATE 4 % EX LIQD: 60.0000 mL | Freq: Once | CUTANEOUS | Status: DC

## 2016-01-31 MED ORDER — ENSURE ENLIVE PO LIQD
237.0000 mL | Freq: Every day | ORAL | Status: DC
  Administered 2016-02-01 – 2016-02-04 (×4): 237 mL via ORAL

## 2016-01-31 MED ORDER — POLYETHYLENE GLYCOL 3350 17 G PO PACK: 17.0000 g | PACK | Freq: Every day | ORAL | Status: DC | PRN

## 2016-01-31 MED ORDER — ETOMIDATE 2 MG/ML IV SOLN
INTRAVENOUS | Status: DC | PRN
  Administered 2016-01-31: 12 mg via INTRAVENOUS

## 2016-01-31 MED ORDER — LIDOCAINE 2% (20 MG/ML) 5 ML SYRINGE
INTRAMUSCULAR | Status: DC | PRN
  Administered 2016-01-31: 50 mg via INTRAVENOUS

## 2016-01-31 MED ORDER — DOCUSATE SODIUM 100 MG PO CAPS
100.0000 mg | ORAL_CAPSULE | Freq: Two times a day (BID) | ORAL | Status: DC
  Administered 2016-01-31 – 2016-02-05 (×8): 100 mg via ORAL
  Filled 2016-01-31 (×8): qty 1

## 2016-01-31 MED ORDER — HYDROCODONE-ACETAMINOPHEN 5-325 MG PO TABS
1.0000 | ORAL_TABLET | Freq: Four times a day (QID) | ORAL | Status: DC | PRN
  Administered 2016-02-01: 2 via ORAL
  Administered 2016-02-01: 1 via ORAL
  Filled 2016-01-31: qty 2
  Filled 2016-01-31: qty 1
  Filled 2016-01-31: qty 2

## 2016-01-31 MED ORDER — ONDANSETRON HCL 4 MG PO TABS: 4.0000 mg | ORAL_TABLET | Freq: Four times a day (QID) | ORAL | Status: DC | PRN

## 2016-01-31 MED ORDER — SUGAMMADEX SODIUM 200 MG/2ML IV SOLN
INTRAVENOUS | Status: DC | PRN
  Administered 2016-01-31: 96.2 mg via INTRAVENOUS

## 2016-01-31 MED ORDER — CEFAZOLIN SODIUM-DEXTROSE 2-4 GM/100ML-% IV SOLN
2.0000 g | Freq: Four times a day (QID) | INTRAVENOUS | Status: AC
  Administered 2016-01-31 – 2016-02-01 (×2): 2 g via INTRAVENOUS
  Filled 2016-01-31 (×2): qty 100

## 2016-01-31 MED ORDER — POVIDONE-IODINE 10 % EX SWAB: 2.0000 "application " | Freq: Once | CUTANEOUS | Status: DC

## 2016-01-31 MED ORDER — METOPROLOL TARTRATE 50 MG PO TABS
50.0000 mg | ORAL_TABLET | Freq: Every day | ORAL | Status: DC
  Administered 2016-02-01 – 2016-02-05 (×5): 50 mg via ORAL
  Filled 2016-01-31 (×4): qty 1

## 2016-01-31 MED ORDER — ATORVASTATIN CALCIUM 40 MG PO TABS
40.0000 mg | ORAL_TABLET | Freq: Every day | ORAL | Status: DC
  Administered 2016-02-01 – 2016-02-04 (×4): 40 mg via ORAL
  Filled 2016-01-31: qty 1

## 2016-01-31 MED ORDER — METOCLOPRAMIDE HCL 5 MG/ML IJ SOLN: 5.0000 mg | Freq: Three times a day (TID) | INTRAMUSCULAR | Status: DC | PRN

## 2016-01-31 MED ORDER — ACETAMINOPHEN 500 MG PO TABS: 1000.0000 mg | ORAL_TABLET | Freq: Two times a day (BID) | ORAL | Status: DC

## 2016-01-31 MED ORDER — ROCURONIUM BROMIDE 50 MG/5ML IV SOSY
PREFILLED_SYRINGE | INTRAVENOUS | Status: AC
  Filled 2016-01-31: qty 5

## 2016-01-31 MED ORDER — METHOCARBAMOL 1000 MG/10ML IJ SOLN
500.0000 mg | Freq: Four times a day (QID) | INTRAVENOUS | Status: DC | PRN
  Filled 2016-01-31: qty 5

## 2016-01-31 MED ORDER — METOCLOPRAMIDE HCL 5 MG PO TABS: 5.0000 mg | ORAL_TABLET | Freq: Three times a day (TID) | ORAL | Status: DC | PRN

## 2016-01-31 MED ORDER — SODIUM CHLORIDE 0.9 % IV SOLN: Freq: Once | INTRAVENOUS | Status: DC

## 2016-01-31 MED ORDER — SENNA 8.6 MG PO TABS
1.0000 | ORAL_TABLET | Freq: Two times a day (BID) | ORAL | Status: DC
  Administered 2016-01-31 – 2016-02-05 (×8): 8.6 mg via ORAL
  Filled 2016-01-31 (×8): qty 1

## 2016-01-31 MED ORDER — ADULT MULTIVITAMIN W/MINERALS CH
1.0000 | ORAL_TABLET | Freq: Every day | ORAL | Status: DC
  Administered 2016-02-01 – 2016-02-05 (×5): 1 via ORAL
  Filled 2016-01-31 (×4): qty 1

## 2016-01-31 MED ORDER — PHENOL 1.4 % MT LIQD: 1.0000 | OROMUCOSAL | Status: DC | PRN

## 2016-01-31 MED ORDER — ONDANSETRON HCL 4 MG/2ML IJ SOLN: 4.0000 mg | Freq: Four times a day (QID) | INTRAMUSCULAR | Status: DC | PRN

## 2016-01-31 MED ORDER — PROMETHAZINE HCL 25 MG/ML IJ SOLN: 6.2500 mg | INTRAMUSCULAR | Status: DC | PRN

## 2016-01-31 MED ORDER — MENTHOL 3 MG MT LOZG: 1.0000 | LOZENGE | OROMUCOSAL | Status: DC | PRN

## 2016-01-31 MED ORDER — ENOXAPARIN SODIUM 40 MG/0.4ML ~~LOC~~ SOLN
40.0000 mg | SUBCUTANEOUS | Status: DC
  Administered 2016-02-01 – 2016-02-05 (×5): 40 mg via SUBCUTANEOUS
  Filled 2016-01-31 (×4): qty 0.4

## 2016-01-31 MED ORDER — FENTANYL CITRATE (PF) 100 MCG/2ML IJ SOLN: 25.0000 ug | INTRAMUSCULAR | Status: DC | PRN

## 2016-01-31 SURGICAL SUPPLY — 71 items
BAG DECANTER FOR FLEXI CONT (MISCELLANEOUS) IMPLANT
BANDAGE ELASTIC 6 VELCRO ST LF (GAUZE/BANDAGES/DRESSINGS) ×3 IMPLANT
BIT DRILL 4.3 (BIT) ×5 IMPLANT
BIT DRILL 4.3MM (BIT) ×2
BIT DRILL 4.3X300MM (BIT) ×2 IMPLANT
BLADE SAW SGTL 18X1.27X75 (BLADE) IMPLANT
BLADE SAW SGTL 18X1.27X75MM (BLADE)
BNDG GAUZE ELAST 4 BULKY (GAUZE/BANDAGES/DRESSINGS) ×3 IMPLANT
CAP LOCK NCB (Cap) ×15 IMPLANT
CHLORAPREP W/TINT 26ML (MISCELLANEOUS) ×3 IMPLANT
COVER SURGICAL LIGHT HANDLE (MISCELLANEOUS) ×3 IMPLANT
DERMABOND ADVANCED (GAUZE/BANDAGES/DRESSINGS) ×2
DERMABOND ADVANCED .7 DNX12 (GAUZE/BANDAGES/DRESSINGS) ×1 IMPLANT
DRAPE HIP W/POCKET STRL (DRAPE) ×3 IMPLANT
DRAPE INCISE IOBAN 66X45 STRL (DRAPES) ×3 IMPLANT
DRAPE INCISE IOBAN 85X60 (DRAPES) ×3 IMPLANT
DRAPE POUCH INSTRU U-SHP 10X18 (DRAPES) ×3 IMPLANT
DRAPE SURG 17X11 SM STRL (DRAPES) ×3 IMPLANT
DRAPE U-SHAPE 47X51 STRL (DRAPES) ×3 IMPLANT
DRILL BIT 4.3 (BIT) ×2
DRSG ADAPTIC 3X8 NADH LF (GAUZE/BANDAGES/DRESSINGS) ×3 IMPLANT
DRSG AQUACEL AG ADV 3.5X10 (GAUZE/BANDAGES/DRESSINGS) IMPLANT
DRSG PAD ABDOMINAL 8X10 ST (GAUZE/BANDAGES/DRESSINGS) ×3 IMPLANT
ELECT BLADE TIP CTD 4 INCH (ELECTRODE) IMPLANT
ELECT REM PT RETURN 9FT ADLT (ELECTROSURGICAL) ×3
ELECTRODE REM PT RTRN 9FT ADLT (ELECTROSURGICAL) ×1 IMPLANT
FACESHIELD WRAPAROUND (MASK) IMPLANT
GAUZE SPONGE 4X4 12PLY STRL (GAUZE/BANDAGES/DRESSINGS) ×3 IMPLANT
GLOVE BIO SURGEON STRL SZ 6.5 (GLOVE) ×2 IMPLANT
GLOVE BIO SURGEON STRL SZ7.5 (GLOVE) ×3 IMPLANT
GLOVE BIO SURGEON STRL SZ8.5 (GLOVE) ×3 IMPLANT
GLOVE BIO SURGEONS STRL SZ 6.5 (GLOVE) ×1
GLOVE BIOGEL PI IND STRL 7.5 (GLOVE) ×1 IMPLANT
GLOVE BIOGEL PI IND STRL 8.5 (GLOVE) ×2 IMPLANT
GLOVE BIOGEL PI INDICATOR 7.5 (GLOVE) ×2
GLOVE BIOGEL PI INDICATOR 8.5 (GLOVE) ×4
GLOVE ECLIPSE 8.0 STRL XLNG CF (GLOVE) ×3 IMPLANT
GOWN SPEC L3 XXLG W/TWL (GOWN DISPOSABLE) ×6 IMPLANT
GOWN STRL REUS W/TWL 2XL LVL3 (GOWN DISPOSABLE) ×3 IMPLANT
K-WIRE 2.0 (WIRE) ×2
K-WIRE FXSTD 280X2XNS SS (WIRE) ×1
KIT BASIN OR (CUSTOM PROCEDURE TRAY) ×3 IMPLANT
KWIRE FXSTD 280X2XNS SS (WIRE) ×1 IMPLANT
MANIFOLD NEPTUNE II (INSTRUMENTS) ×3 IMPLANT
NDL SAFETY ECLIPSE 18X1.5 (NEEDLE) ×1 IMPLANT
NEEDLE HYPO 18GX1.5 SHARP (NEEDLE) ×2
NS IRRIG 1000ML POUR BTL (IV SOLUTION) ×3 IMPLANT
PACK TOTAL JOINT (CUSTOM PROCEDURE TRAY) ×3 IMPLANT
PLATE DISTAL FEMUR 15H 317M RT (Plate) ×3 IMPLANT
SCREW 5.0 70MM (Screw) ×3 IMPLANT
SCREW 5.0 80MM (Screw) ×9 IMPLANT
SCREW NCB 5.0X34MM (Screw) ×6 IMPLANT
SCREW NCB 5.0X36MM (Screw) ×6 IMPLANT
SCREW NCB 5.0X85MM (Screw) ×3 IMPLANT
STAPLER VISISTAT 35W (STAPLE) ×3 IMPLANT
SUCTION FRAZIER HANDLE 10FR (MISCELLANEOUS) ×2
SUCTION TUBE FRAZIER 10FR DISP (MISCELLANEOUS) ×1 IMPLANT
SUT ETHIBOND 2 V 37 (SUTURE) IMPLANT
SUT ETHILON 2 0 PSLX (SUTURE) ×6 IMPLANT
SUT MNCRL AB 4-0 PS2 18 (SUTURE) IMPLANT
SUT MON AB 2-0 CT1 36 (SUTURE) IMPLANT
SUT VIC AB 1 CT1 27 (SUTURE) ×2
SUT VIC AB 1 CT1 27XBRD ANBCTR (SUTURE) ×1 IMPLANT
SUT VIC AB 2-0 CT1 27 (SUTURE) ×4
SUT VIC AB 2-0 CT1 TAPERPNT 27 (SUTURE) ×2 IMPLANT
SUT VLOC 180 0 24IN GS25 (SUTURE) IMPLANT
SYR 50ML LL SCALE MARK (SYRINGE) IMPLANT
TOWEL OR 17X26 10 PK STRL BLUE (TOWEL DISPOSABLE) ×6 IMPLANT
TRAY CATH 16FR W/PLASTIC CATH (SET/KITS/TRAYS/PACK) IMPLANT
TRAY FOLEY CATH 16FRSI W/METER (SET/KITS/TRAYS/PACK) IMPLANT
WIRE 18GA COCR 1PK (WIRE) ×3 IMPLANT

## 2016-01-31 NOTE — H&P (Signed)
[place] @ [facility] Admission History and Physical Harvie Bridge, D.O.  ---------------------------------------------------------------------------------------------------------------------   PATIENT NAME: [name] MR#: [id number] DATE OF BIRTH: [birth year] DATE OF ADMISSION: 01/30/2016 PRIMARY CARE PHYSICIAN: Valera Castle, MD  REQUESTING/REFERRING PHYSICIAN: ED Dr. Alfred Levins  CHIEF COMPLAINT: Chief Complaint  Patient presents with  . Fall  . Leg Pain    HISTORY OF PRESENT ILLNESS:  [name] is a 82 y.o. femaleWith a past medical history of  CVA on Plavix, WPW, HTN who presents for evaluation of R leg pain s/p mechanical fall. Patient states that she fell in her closet landing on her right side. She denies any presyncope, syncopal events, loss of consciousness or head trauma. She pressed her medical alert button to call for help because she was unable to stand or bear weight. Tonight she complains of severe pain with movement of the right leg which is improved with pain medication.   in terms of her functional capacity, patient lives independently. She does physical therapy on a daily basis although she does not ascend stairs. She has had a recent echocardiogram which showed an LV ejection fraction of 55-60%. She has no history of diabetes.   Otherwise there has been no change in status. Patient has been taking medication as prescribed and there has been no recent change in medication or diet.  There has been no recent illness, travel or sick contacts.    Patient denies fevers/chills, weakness, dizziness, chest pain, shortness of breath, N/V/C/D, abdominal pain, dysuria/frequency, changes in mental status.   EMS/ED COURSE:   Patient recefentanyl.  PAST MEDICAL HISTORY: Past Medical History:  Diagnosis Date  . Hypertension   . WPW (Wolff-Parkinson-White syndrome)       PAST SURGICAL HISTORY: Past Surgical History:  Procedure Laterality Date   . MASTECTOMY    . REPLACEMENT TOTAL KNEE        SOCIAL HISTORY: Social History  Substance Use Topics  . Smoking status: Never Smoker  . Smokeless tobacco: Never Used  . Alcohol use Not on file    FAMILY HISTORY: Cancer Brother  lymphoma  Coronary Artery Disease (Blocked arteries around heart) Brother    Colon cancer Brother    Cancer Brother  prostate  Cancer Father  lung  Cancer Mother  ? origin  Breast cancer Sister       MEDICATIONS AT HOME: Prior to Admission medications   Medication Sig Start Date End Date Taking? Authorizing Provider  acetaminophen (TYLENOL) 500 MG tablet Take 1,000 mg by mouth 2 (two) times daily.   Yes Historical Provider, MD  amLODipine (NORVASC) 5 MG tablet Take 5 mg by mouth daily.   Yes Historical Provider, MD  atorvastatin (LIPITOR) 40 MG tablet Take 1 tablet (40 mg total) by mouth daily at 6 PM. 11/07/15  Yes Loletha Grayer, MD  benazepril (LOTENSIN) 40 MG tablet Take 40 mg by mouth daily.   Yes Historical Provider, MD  clopidogrel (PLAVIX) 75 MG tablet Take 1 tablet (75 mg total) by mouth daily. 11/08/15  Yes Loletha Grayer, MD  ibuprofen (ADVIL,MOTRIN) 200 MG tablet Take 400 mg by mouth every 6 (six) hours as needed.   Yes Historical Provider, MD  metoprolol (LOPRESSOR) 50 MG tablet Take 50 mg by mouth daily.   Yes Historical Provider, MD  Multiple Vitamin (MULTIVITAMIN WITH MINERALS) TABS tablet Take 1 tablet by mouth daily.   Yes Historical Provider, MD      DRUG ALLERGIES: No Known Allergies   REVIEW  OF SYSTEMS: CONSTITUTIONAL: No fatigue, weakness, fever, chills, weight gain/loss, headache EYES: No blurry or double vision. ENT: No tinnitus, postnasal drip, redness or soreness of the oropharynx. RESPIRATORY: No dyspnea, cough, wheeze, hemoptysis. CARDIOVASCULAR: No chest pain, orthopnea, palpitations, syncope. GASTROINTESTINAL: No nausea, vomiting, constipation, diarrhea, abdominal pain. No hematemesis, melena or  hematochezia. GENITOURINARY: No dysuria, frequency, hematuria. ENDOCRINE: No polyuria or nocturia. No heat or cold intolerance. HEMATOLOGY: No anemia, bruising, bleeding. INTEGUMENTARY: No rashes, ulcers, lesions. MUSCULOSKELETAL: No pain, arthritis, swelling, gout.Positive right leg pain with movement.  NEUROLOGIC: No numbness, tingling, weakness or ataxia. No seizure-type activity. PSYCHIATRIC: No anxiety, depression, insomnia.  PHYSICAL EXAMINATION: VITAL SIGNS: Blood pressure (!) 134/53, pulse 84, temperature 97.6 F (36.4 C), temperature source Oral, resp. rate 13, height 4\' 9"  (1.448 m), weight 48.1 kg (106 lb), SpO2 100 %.  GENERAL: 82 y.o.-yearpale white female patient, well-developed, well-nourished lying in the bed in no acute distress.  Pleasant and cooperative.   HEENT: Head atraumatic, normocephalic. Pupils equal, round, reactive to light and accommodation. No scleral icterus. Extraocular muscles intact. Oropharynx is clear. Mucus membranes moist. NECK: Supple, full range of motion. No JVD, no bruit heard. No cervical lymphadenopathy. CHEST: Normal breath sounds bilaterally. No wheezing, rales, rhonchi or crackles. No use of accessory muscles of respiration.  No reproducible chest wall tenderness.  CARDIOVASCULAR: S1, S2 normal. No murmurs, rubs, or gallops appreciated. Cap refill <2 seconds. ABDOMEN: Soft, nontender, nondistended. No rebound, guarding, rigidity. Normoactive bowel sounds present in all four quadrants. No organomegaly or mass. EXTREMITIES: There is significant deformity of the right leg at the distal femur. Severe tenderness to palpation and with minimal range of motion. No pedal edema, cyanosis, or clubbing. NEUROLOGIC: Cranial nerves II through XII are grossly intact with no focal sensorimotor deficit. Muscle strength 5/5 in all extremities. Sensation intact. Gait not checked. PSYCHIATRIC: The patient is alert and oriented x 3. Normal affect, mood, thought  content. SKIN: Warm, dry, and intact without obvious rash, lesion, or ulcer.  LABORATORY PANEL:  CBC  Recent Labs Lab 01/30/16 2352  WBC 13.6*  HGB 9.8*  HCT 28.3*  PLT 227   ----------------------------------------------------------------------------------------------------------------- Chemistries  Recent Labs Lab 01/30/16 2352  NA 128*  K 4.5  CL 98*  CO2 25  GLUCOSE 162*  BUN 29*  CREATININE 0.78  CALCIUM 8.5*   ------------------------------------------------------------------------------------------------------------------ Cardiac Enzymes No results for input(s): TROPONINI in the last 168 hours. ------------------------------------------------------------------------------------------------------------------  RADIOLOGY: Dg Pelvis 1-2 Views  Result Date: 01/30/2016 CLINICAL DATA:  Fall today at nursing home. RIGHT distal femur bruising. EXAM: PELVIS - 1-2 VIEW COMPARISON:  Pelvic radiograph January 20, 2012 FINDINGS: There is no evidence of pelvic fracture or diastasis. No pelvic bone lesions are seen. Osteopenia. Severe degenerative change of the included cervical spine. IMPRESSION: Negative. Electronically Signed   By: Elon Alas M.D.   On: 01/30/2016 22:43   Ct Head Wo Contrast  Result Date: 01/30/2016 CLINICAL DATA:  Fall today at nursing home. Distal femur bruising and RIGHT leg pain. History of hypertension. EXAM: CT HEAD WITHOUT CONTRAST TECHNIQUE: Contiguous axial images were obtained from the base of the skull through the vertex without intravenous contrast. COMPARISON:  MRI head November 07, 2015. FINDINGS: BRAIN: The ventricles and sulci are normal for age. No intraparenchymal hemorrhage, mass effect nor midline shift. Patchy supratentorial white matter hypodensities less than expected for patient's age, though non-specific are most compatible with chronic small vessel ischemic disease. No acute large vascular territory infarcts. Old LEFT thalamus  lacunar infarcts.  No abnormal extra-axial fluid collections. Basal cisterns are patent. VASCULAR: Mild calcific atherosclerosis of the carotid siphons. SKULL: No skull fracture. No significant scalp soft tissue swelling. SINUSES/ORBITS: The mastoid air-cells and included paranasal sinuses are well-aerated.The included ocular globes and orbital contents are non-suspicious. OTHER: None. IMPRESSION: No acute intracranial process. Stable examination including old LEFT thalamus lacunar infarct and mild to moderate chronic small vessel ischemic disease. Electronically Signed   By: Elon Alas M.D.   On: 01/30/2016 22:46   Ct Knee Right Wo Contrast  Result Date: 01/31/2016 CLINICAL DATA:  82 year old female with right femoral fracture. Right knee arthroplasty periods EXAM: CT OF THE right KNEE WITHOUT CONTRAST TECHNIQUE: Multidetector CT imaging of the right knee was performed according to the standard protocol. Multiplanar CT image reconstructions were also generated. COMPARISON:  Right femoral radiograph dated 01/30/2016 FINDINGS: Bones/Joint/Cartilage There is a comminuted and displaced oblique fracture of the distal third of the right femur. There is approximately 2 cm anterior displacement of the largest distal fracture fragment. There is lateral displacement and posterior angulation of the distal femur and knee in relation to the femoral shaft. There is approximately 3.5 cm overlap. The fractures extend into the distal femur and to the arthroplasty. Evaluation however is limited due to streak artifact caused by metallic knee arthroplasty. There is apparent marrow edema and hematoma at the fracture site.  A There is a total knee arthroplasty. The arthroplasty components appear intact and in anatomic alignment. The bones are osteopenic. Ligaments Suboptimally assessed by CT. Muscles and Tendons There is edema and small hematoma within the distal femoral musculature. No large fluid collections or hematoma.  Soft tissues Diffuse soft tissue edema and stranding of the distal femur. IMPRESSION: Comminuted and displaced oblique fracture of the distal femur with extension into the femoral component of the arthroplasty. A large distal fracture fragment appears anterior and laterally displaced. There is mild lateral displacement and posterior angulation of the distal femur in relation to the femoral shaft. The arthroplasty component appears intact and in anatomic alignment. Electronically Signed   By: Anner Crete M.D.   On: 01/31/2016 00:53   Dg Chest Portable 1 View  Result Date: 01/30/2016 CLINICAL DATA:  Fall today in nursing home. Preoperative evaluation. EXAM: PORTABLE CHEST 1 VIEW COMPARISON:  Chest radiograph Jul 06, 2015 FINDINGS: Cardiac silhouette is mildly enlarged, mediastinal silhouette is unremarkable, calcified aortic knob. LEFT lung base airspace opacity with hazy densities LEFT lung, LEFT greater than RIGHT apical pleural capping. RIGHT lung is clear. No pneumothorax. Osteopenia. Symmetric vascular calcifications in the neck. IMPRESSION: Stable cardiomegaly. Hazy density LEFT lung concerning for layering pleural effusion with LEFT lung base airspace opacity. Recommend follow-up PA and lateral views the chest when clinically able. Electronically Signed   By: Elon Alas M.D.   On: 01/30/2016 22:51   Dg Femur, Min 2 Views Right  Result Date: 01/30/2016 CLINICAL DATA:  82 year old female with fall and right femoral pain. EXAM: RIGHT FEMUR 2 VIEWS COMPARISON:  None. FINDINGS: There is a displaced oblique and comminuted fracture of the distal femur. There is extension of the fracture to the femoral component of the knee arthroplasty. There is posterior angulation and displacement of the main distal fracture fragment in relation to the femoral shaft. The bones are osteopenic. There is a total right knee arthroplasty. The arthroplasty components appear intact and in anatomic alignment. The soft  tissues are grossly unremarkable. No significant joint effusion identified, however evaluation for the suprapatellar joint is somewhat limited  due to superimposition of the fracture fragments on the cross-table lateral view. IMPRESSION: Comminuted and displaced oblique fracture of the distal femur with extension of the fracture to the femoral component of the arthroplasty. Orthopedic consult is advised. Electronically Signed   By: Anner Crete M.D.   On: 01/30/2016 22:47    [id number] fibrillation at 92 bpm with normal axis right bundle-branch block, flipped T's in leads V3 and 4  and nonspecific ST-T wave changes.   IMPRESSION AND PLAN:  This is a 82 y.o. female with a history of  of CVA on Plavix, hypertension, Wolff-Parkinson-White now being admitted with:  1.  Comminuted and displaced oblique fracture of the right distal femur with extension into arthroplasty -Admit to inpatient -Pain control -Orthopedic consultation -Preop cardiovascular evaluation given advanced age, history of Wolff-Parkinson-White, hypertension and new onset of atrial ablation -Patient will be nothing by mouth  2. New onset atrial fibrillation, rate controlled -Continue metoprolol -Cardiology consultation has been requested -Recent echo done in September 2017  3. Hyponatremia -Gentle IV fluid hydration and recheck BMP in a.m.  4. Anemia -Iron studies, B12, folate, repeat CBC in a.m. -No history of rectal bleeding. Check FOBT.  5. History of hypertension -continue Norvasc, benazepril, metoprolol  6. History of hyperlipidemia -continue Lipitor  7. History of CVA -Hold Plavix for OR  Diet/Nutrition: nothing by mouth Fluids: IV normal saline DVT Px: Lovenox, SCDs and early ambulation Code Status: Full  All the records are reviewed and case discussed with ED provider. Management plans discussed with the patient and/or family who express understanding and agree with plan of care.   TOTAL TIME TAKING  CARE OF THIS PATIENT: 60 minutes.   Maija Biggers D.O. on 01/31/2016 at 1:08 AM Between 7am to 6pm - Pager - [phone] After 6pm go to [email or web address] - Proofreader [facility] [place] [facility] Office [phone] CC: Primary care physician; Valera Castle, MD     Note: This dictation was prepared with Dragon dictation along with smaller phrase technology. Any transcriptional errors that result from this process are unintentional.

## 2016-01-31 NOTE — Transfer of Care (Signed)
Immediate Anesthesia Transfer of Care Note  Patient: [name]  Procedure(s) Performed: Procedure(s): OPEN REDUCTION INTERNAL FIXATION DISTAL FEMUR FRACTURE (Right)  Patient Location: PACU  Anesthesia Type:General  Level of Consciousness: awake  Airway & Oxygen Therapy: Patient Spontanous Breathing and Patient connected to nasal cannula oxygen  Post-op Assessment: Report given to RN and Post -op Vital signs reviewed and stable  Post vital signs: Reviewed and stable  Last Vitals:  Vitals:   01/31/16 0458 01/31/16 1623  BP: 91/70 (!) 132/36  Pulse: 80 80  Resp: 16 16  Temp: 36.6 C 36.7 C    Last Pain:  Vitals:   01/31/16 1623  TempSrc: Oral  PainSc:          Complications: No apparent anesthesia complications

## 2016-01-31 NOTE — H&P (Addendum)
History and Physical    RUIE WOLFROM [id number] DOB: [birth year] DOA: 01/31/2016   PCP: Valera Castle, MD Chief Complaint: No chief complaint on file.   HPI: [name] is a 82 y.o. female with medical history significant of CVA on Plavix, WPW, HTN who presents for evaluation of R leg pain s/p mechanical fall.  Patient states that she fell in her closet landing on her right side.  She denies any presyncope, syncopal events, loss of consciousness or head trauma.  She pressed her medical alert button to call for help because she was unable to stand or bear weight on R leg.  Tonight she complains of severe pain with movement of the right leg which is improved with pain medication.  At baseline in terms of her functional capacity, patient lives independently. She does physical therapy on a daily basis although she does not ascend stairs. She has had a recent echocardiogram which showed an LV ejection fraction of 55-60%. She has no history of diabetes.   Otherwise there has been no change in status. Patient has been taking medication as prescribed and there has been no recent change in medication or diet.  There has been no recent illness, travel or sick contacts.    Patient denies fevers/chills, weakness, dizziness, chest pain, shortness of breath, N/V/C/D, abdominal pain, dysuria/frequency, changes in mental status.  ED Course: Patient found to have R distal femur fracture with involvement in the area associated with the TKA (see CT scan for full details).  Orthopaedic surgeon at [facility] felt that fracture was more advanced than could be handled there and that patient would require transfer and management by a reconstructive sub-specialist.  Review of Systems: As per HPI otherwise 10 point review of systems negative.    Past Medical History:  Diagnosis Date  . Hypertension   . WPW (Wolff-Parkinson-White syndrome)     Past Surgical History:  Procedure Laterality Date  .  MASTECTOMY    . REPLACEMENT TOTAL KNEE       reports that she has never smoked. She has never used smokeless tobacco. Her alcohol and drug histories are not on file.  No Known Allergies  Family History  Problem Relation Age of Onset  . Lymphoma Brother   . CAD Brother   . Prostate cancer Brother   . Cancer Mother   . Cancer Father       Prior to Admission medications   Medication Sig Start Date End Date Taking? Authorizing Provider  acetaminophen (TYLENOL) 500 MG tablet Take 1,000 mg by mouth 2 (two) times daily.    Historical Provider, MD  amLODipine (NORVASC) 5 MG tablet Take 5 mg by mouth daily.    Historical Provider, MD  atorvastatin (LIPITOR) 40 MG tablet Take 1 tablet (40 mg total) by mouth daily at 6 PM. 11/07/15   Loletha Grayer, MD  benazepril (LOTENSIN) 40 MG tablet Take 40 mg by mouth daily.    Historical Provider, MD  clopidogrel (PLAVIX) 75 MG tablet Take 1 tablet (75 mg total) by mouth daily. 11/08/15   Loletha Grayer, MD  ibuprofen (ADVIL,MOTRIN) 200 MG tablet Take 400 mg by mouth every 6 (six) hours as needed.    Historical Provider, MD  metoprolol (LOPRESSOR) 50 MG tablet Take 50 mg by mouth daily.    Historical Provider, MD  Multiple Vitamin (MULTIVITAMIN WITH MINERALS) TABS tablet Take 1 tablet by mouth daily.    Historical Provider, MD    Physical Exam: There were no  vitals filed for this visit. BP 134/53, P 84, RR 13, T 97.6, O2 100% RA   Constitutional: NAD, calm, comfortable Eyes: PERRL, lids and conjunctivae normal ENMT: Mucous membranes are moist. Posterior pharynx clear of any exudate or lesions.Normal dentition.  Neck: normal, supple, no masses, no thyromegaly Respiratory: clear to auscultation bilaterally, no wheezing, no crackles. Normal respiratory effort. No accessory muscle use.  Cardiovascular: Regular rate and rhythm, no murmurs / rubs / gallops. No extremity edema. 2+ pedal pulses. No carotid bruits.  Abdomen: no tenderness, no masses  palpated. No hepatosplenomegaly. Bowel sounds positive.  Musculoskeletal: no clubbing / cyanosis. R knee in immobilizer Good ROM, no contractures. Normal muscle tone.  Skin: no rashes, lesions, ulcers. No induration Neurologic: CN 2-12 grossly intact. Sensation intact, DTR normal. Strength 5/5 in all 4.  Psychiatric: Normal judgment and insight. Alert and oriented x 3. Normal mood.    Labs on Admission: I have personally reviewed following labs and imaging studies  CBC:  Recent Labs Lab 01/30/16 2352  WBC 13.6*  HGB 9.8*  HCT 28.3*  MCV 94.2  PLT [id number]   Basic Metabolic Panel:  Recent Labs Lab 01/30/16 2352  NA 128*  K 4.5  CL 98*  CO2 25  GLUCOSE 162*  BUN 29*  CREATININE 0.78  CALCIUM 8.5*   GFR: Estimated Creatinine Clearance: 31.3 mL/min (by C-G formula based on SCr of 0.78 mg/dL). Liver Function Tests: No results for input(s): AST, ALT, ALKPHOS, BILITOT, PROT, ALBUMIN in the last 168 hours. No results for input(s): LIPASE, AMYLASE in the last 168 hours. No results for input(s): AMMONIA in the last 168 hours. Coagulation Profile:  Recent Labs Lab 01/30/16 2352  INR 1.13   Cardiac Enzymes: No results for input(s): CKTOTAL, CKMB, CKMBINDEX, TROPONINI in the last 168 hours. BNP (last 3 results) No results for input(s): PROBNP in the last 8760 hours. HbA1C: No results for input(s): HGBA1C in the last 72 hours. CBG: No results for input(s): GLUCAP in the last 168 hours. Lipid Profile: No results for input(s): CHOL, HDL, LDLCALC, TRIG, CHOLHDL, LDLDIRECT in the last 72 hours. Thyroid Function Tests: No results for input(s): TSH, T4TOTAL, FREET4, T3FREE, THYROIDAB in the last 72 hours. Anemia Panel: No results for input(s): VITAMINB12, FOLATE, FERRITIN, TIBC, IRON, RETICCTPCT in the last 72 hours. Urine analysis:    Component Value Date/Time   COLORURINE YELLOW (A) 01/31/2016 0059   APPEARANCEUR CLEAR (A) 01/31/2016 0059   APPEARANCEUR Clear 07/11/2011  0011   LABSPEC 1.021 01/31/2016 0059   LABSPEC 1.006 07/11/2011 0011   PHURINE 6.0 01/31/2016 0059   GLUCOSEU NEGATIVE 01/31/2016 0059   GLUCOSEU Negative 07/11/2011 0011   HGBUR NEGATIVE 01/31/2016 0059   BILIRUBINUR NEGATIVE 01/31/2016 0059   BILIRUBINUR Negative 07/11/2011 0011   KETONESUR 5 (A) 01/31/2016 0059   PROTEINUR 30 (A) 01/31/2016 0059   NITRITE NEGATIVE 01/31/2016 0059   LEUKOCYTESUR NEGATIVE 01/31/2016 0059   LEUKOCYTESUR 3+ 07/11/2011 0011   Sepsis Labs: @LABRCNTIP (procalcitonin:4,lacticidven:4) )No results found for this or any previous visit (from the past 240 hour(s)).   Radiological Exams on Admission: Dg Pelvis 1-2 Views  Result Date: 01/30/2016 CLINICAL DATA:  Fall today at nursing home. RIGHT distal femur bruising. EXAM: PELVIS - 1-2 VIEW COMPARISON:  Pelvic radiograph January 20, 2012 FINDINGS: There is no evidence of pelvic fracture or diastasis. No pelvic bone lesions are seen. Osteopenia. Severe degenerative change of the included cervical spine. IMPRESSION: Negative. Electronically Signed   By: Thana Farr.D.  On: 01/30/2016 22:43   Ct Head Wo Contrast  Result Date: 01/30/2016 CLINICAL DATA:  Fall today at nursing home. Distal femur bruising and RIGHT leg pain. History of hypertension. EXAM: CT HEAD WITHOUT CONTRAST TECHNIQUE: Contiguous axial images were obtained from the base of the skull through the vertex without intravenous contrast. COMPARISON:  MRI head November 07, 2015. FINDINGS: BRAIN: The ventricles and sulci are normal for age. No intraparenchymal hemorrhage, mass effect nor midline shift. Patchy supratentorial white matter hypodensities less than expected for patient's age, though non-specific are most compatible with chronic small vessel ischemic disease. No acute large vascular territory infarcts. Old LEFT thalamus lacunar infarcts. No abnormal extra-axial fluid collections. Basal cisterns are patent. VASCULAR: Mild calcific  atherosclerosis of the carotid siphons. SKULL: No skull fracture. No significant scalp soft tissue swelling. SINUSES/ORBITS: The mastoid air-cells and included paranasal sinuses are well-aerated.The included ocular globes and orbital contents are non-suspicious. OTHER: None. IMPRESSION: No acute intracranial process. Stable examination including old LEFT thalamus lacunar infarct and mild to moderate chronic small vessel ischemic disease. Electronically Signed   By: Elon Alas M.D.   On: 01/30/2016 22:46   Ct Knee Right Wo Contrast  Result Date: 01/31/2016 CLINICAL DATA:  82 year old female with right femoral fracture. Right knee arthroplasty periods EXAM: CT OF THE right KNEE WITHOUT CONTRAST TECHNIQUE: Multidetector CT imaging of the right knee was performed according to the standard protocol. Multiplanar CT image reconstructions were also generated. COMPARISON:  Right femoral radiograph dated 01/30/2016 FINDINGS: Bones/Joint/Cartilage There is a comminuted and displaced oblique fracture of the distal third of the right femur. There is approximately 2 cm anterior displacement of the largest distal fracture fragment. There is lateral displacement and posterior angulation of the distal femur and knee in relation to the femoral shaft. There is approximately 3.5 cm overlap. The fractures extend into the distal femur and to the arthroplasty. Evaluation however is limited due to streak artifact caused by metallic knee arthroplasty. There is apparent marrow edema and hematoma at the fracture site.  A There is a total knee arthroplasty. The arthroplasty components appear intact and in anatomic alignment. The bones are osteopenic. Ligaments Suboptimally assessed by CT. Muscles and Tendons There is edema and small hematoma within the distal femoral musculature. No large fluid collections or hematoma. Soft tissues Diffuse soft tissue edema and stranding of the distal femur. IMPRESSION: Comminuted and displaced  oblique fracture of the distal femur with extension into the femoral component of the arthroplasty. A large distal fracture fragment appears anterior and laterally displaced. There is mild lateral displacement and posterior angulation of the distal femur in relation to the femoral shaft. The arthroplasty component appears intact and in anatomic alignment. Electronically Signed   By: Anner Crete M.D.   On: 01/31/2016 00:53   Dg Chest Portable 1 View  Result Date: 01/30/2016 CLINICAL DATA:  Fall today in nursing home. Preoperative evaluation. EXAM: PORTABLE CHEST 1 VIEW COMPARISON:  Chest radiograph Jul 06, 2015 FINDINGS: Cardiac silhouette is mildly enlarged, mediastinal silhouette is unremarkable, calcified aortic knob. LEFT lung base airspace opacity with hazy densities LEFT lung, LEFT greater than RIGHT apical pleural capping. RIGHT lung is clear. No pneumothorax. Osteopenia. Symmetric vascular calcifications in the neck. IMPRESSION: Stable cardiomegaly. Hazy density LEFT lung concerning for layering pleural effusion with LEFT lung base airspace opacity. Recommend follow-up PA and lateral views the chest when clinically able. Electronically Signed   By: Elon Alas M.D.   On: 01/30/2016 22:51   Dg Femur,  Min 2 Views Right  Result Date: 01/30/2016 CLINICAL DATA:  82 year old female with fall and right femoral pain. EXAM: RIGHT FEMUR 2 VIEWS COMPARISON:  None. FINDINGS: There is a displaced oblique and comminuted fracture of the distal femur. There is extension of the fracture to the femoral component of the knee arthroplasty. There is posterior angulation and displacement of the main distal fracture fragment in relation to the femoral shaft. The bones are osteopenic. There is a total right knee arthroplasty. The arthroplasty components appear intact and in anatomic alignment. The soft tissues are grossly unremarkable. No significant joint effusion identified, however evaluation for the  suprapatellar joint is somewhat limited due to superimposition of the fracture fragments on the cross-table lateral view. IMPRESSION: Comminuted and displaced oblique fracture of the distal femur with extension of the fracture to the femoral component of the arthroplasty. Orthopedic consult is advised. Electronically Signed   By: Anner Crete M.D.   On: 01/30/2016 22:47    EKG: Independently reviewed.  Assessment/Plan Principal Problem:   Closed fracture of right distal femur (HCC) Active Problems:   Essential hypertension   Hyponatremia   HLD (hyperlipidemia)   Normocytic normochromic anemia   History of stroke    1. Closed fracture of R distal femur - 1. Patient noted to be on OR schedule for ORIF with Dr. Lyla Glassing for 0725 this morning 2. Hip fx pathway 3. Pain ctrl per pathway 4. Repeating EKG, as on my read I do not believe that patient has A.Fib on initial EKG, just a large amount of interference 5. NPO 2. Hyponatremia - 1. Gentle hydration, recheck BMP in AM 3. H/o HTN - 1. Continue metoprolol 2. Due to borderline low BPs (due to narcotics), will hold off on amlodipine and lisinopril for now 4. H/o CVA - holding plavix 5. Normocytic normochromic anemia - 1. repeat CBC in AM, wont re-order anemia pnl in case this was already ordered at [facility] (looking at their H&P prior to transfer) 6. HLD - continue lipitor   DVT prophylaxis: SCDs only for now (hematoma noted associated with fx) will need more anticoagulation post-op (patient to OR in 3 hours from now). Code Status: Full Family Communication: No family in room Consults called: Ortho called Admission status: Admit to inpatient   Etta Quill DO [facility] Pager [phone] from 7PM-7AM  If 7AM-7PM, please contact the day physician for the patient [email or web address] Password [email or web address]  01/31/2016, 3:36 AM

## 2016-01-31 NOTE — Clinical Social Work Note (Signed)
Clinical Social Work Assessment  Patient Details  Name: [name] MRN: [id number] Date of Birth: 1923-04-22  Date of referral:  01/31/16               Reason for consult:  Facility Placement                Permission sought to share information with:  Family Supports Permission granted to share information::  Yes, Verbal Permission Granted  Name::     [name]  Agency::     Relationship::  Son  Contact Information:  [phone]  Housing/Transportation Living arrangements for the past 2 months:  [place] of Information:  Patient, Adult Children Patient Interpreter Needed:  None Criminal Activity/Legal Involvement Pertinent to Current Situation/Hospitalization:  No - Comment as needed Significant Relationships:  Adult Children Lives with:  Self Do you feel safe going back to the place where you live?  Yes Need for family participation in patient care:  No (Coment)  Care giving concerns:  Pt's two sons were at bedside. Pt's son decline any concerns at this time.   Social Worker assessment / plan:  CSW spoke with pt and pt's son at bedside. Pt provided CSW with verbal permission to speak with pt in front of two sons. Pt previously lived in a independent living facility, [facility] in [place]. Pt's sons were inquiring if [facility] had a SNF, as they would prefer pt go there. CSW will reach out to [facility]. Pt has surgery scheduled for today and PT will evaluation and make recommendations. CSW will facilitate pt SNF d/c needs post surgery once medically stable, if PT recommendations state SNF.  Employment status:  Retired Forensic scientist:  Medicare PT Recommendations:  Not assessed at this time [place] / Referral to community resources:  [place]  Patient/Family's Response to care:  Pt verbalized understanding of CSW role and appreciation of support. Pt denies any concern regarding her care at this  time.  Patient/Family's Understanding of and Emotional Response to Diagnosis, Current Treatment, and Prognosis:  Pt and pt's family understanding of pt physical limitations and have realistic expectations following discharge. Pt and pt's family understand that pt may need skilled nursing at d/c. Pt is agreeable to SNF placement at this time. Pt denies any questions or concerns regarding her treatment plan at this time. CSW will continue to provide support to pt and pt's family.  Emotional Assessment Appearance:  Appears stated age Attitude/Demeanor/Rapport:   (Patient was appropriate.) Affect (typically observed):  Accepting, Appropriate, Calm Orientation:  Oriented to Self, Oriented to Place, Oriented to Situation, Oriented to  Time Alcohol / Substance use:  Not Applicable Psych involvement (Current and /or in the community):  No (Comment)  Discharge Needs  Concerns to be addressed:  No discharge needs identified Readmission within the last 30 days:  No Current discharge risk:  Dependent with Mobility Barriers to Discharge:  Continued Medical Work up   [employer], LCSW 01/31/2016, 1:44 PM

## 2016-01-31 NOTE — Discharge Instructions (Signed)
Touch down weight bearing right leg PT for ROM R knee and transfer training In 4 days, you may remove your dressing and perform daily dry dressing changes with ABDs and paper tape

## 2016-01-31 NOTE — Anesthesia Preprocedure Evaluation (Addendum)
Anesthesia Evaluation  Patient identified by MRN, date of birth, ID band Patient awake    Reviewed: Allergy & Precautions, NPO status , Patient's Chart, lab work & pertinent test results  Airway Mallampati: II  TM Distance: >3 FB Neck ROM: Full    Dental no notable dental hx. (+) Teeth Intact, Dental Advisory Given   Pulmonary neg pulmonary ROS,    Pulmonary exam normal breath sounds clear to auscultation       Cardiovascular hypertension, Normal cardiovascular exam Rhythm:Regular Rate:Normal  Left ventricle:  Systolic function was normal. The estimated ejection fraction was in the range of 55% to 60%.  ------------------------------------------------------------------- Aortic valve:  Poorly visualized.  Doppler:  There was mild regurgitation.    Peak velocity ratio of LVOT to aortic valve: 0.7. Valve area (Vmax): 2.2 cm^2. Indexed valve area (Vmax): 1.5 cm^2/m^2.    Peak gradient (S): 4 mm Hg.  ------------------------------------------------------------------- Mitral valve:  Poorly visualized.  Doppler:  There was trivial regurgitation.    Peak gradient (D): 2 mm Hg.   Neuro/Psych CVA negative psych ROS   GI/Hepatic negative GI ROS, Neg liver ROS,   Endo/Other  negative endocrine ROS  Renal/GU negative Renal ROS  negative genitourinary   Musculoskeletal negative musculoskeletal ROS (+)   Abdominal   Peds negative pediatric ROS (+)  Hematology  (+) anemia ,   Anesthesia Other Findings   Reproductive/Obstetrics negative OB ROS                            Anesthesia Physical Anesthesia Plan  ASA: III  Anesthesia Plan: General   Post-op Pain Management:    Induction: Intravenous  Airway Management Planned: Oral ETT  Additional Equipment:   Intra-op Plan:   Post-operative Plan: Extubation in OR  Informed Consent: I have reviewed the patients History and Physical, chart,  labs and discussed the procedure including the risks, benefits and alternatives for the proposed anesthesia with the patient or authorized representative who has indicated his/her understanding and acceptance.   Dental advisory given  Plan Discussed with: CRNA and Surgeon  Anesthesia Plan Comments: (Normal echo in September, EKG with T wave changes in inferior leads. Today the T changes are slightly more prominent)        Anesthesia Quick Evaluation

## 2016-01-31 NOTE — NC FL2 (Signed)
[place] MEDICAID FL2 LEVEL OF CARE SCREENING TOOL     IDENTIFICATION  Patient Name: [name] Birthdate: 1923-08-20 Sex: female Admission Date (Current Location): 01/31/2016  [facility] and [place] Number:  Herbalist and Address:  [facility] [place], [place] [address] [place],  [address]      Provider Number: [id number]  Attending Physician Name and Address:  Mendel Corning, MD  Relative Name and Phone Number:       Current Level of Care: Hospital Recommended Level of Care: [place] Prior Approval Number:    Date Approved/Denied: 07/11/11 PASRR Number:  [id number]   Discharge Plan: SNF    Current Diagnoses: Patient Active Problem List   Diagnosis Date Noted  . Closed fracture of right distal femur ([place]) 01/31/2016  . HLD (hyperlipidemia) 01/31/2016  . Normocytic normochromic anemia 01/31/2016  . History of stroke 01/31/2016  . Hip fracture ([place]) 01/31/2016  . CVA (cerebral infarction) 11/06/2015  . Essential hypertension 11/06/2015  . Hyponatremia 11/06/2015  . Hyperglycemia 11/06/2015    Orientation RESPIRATION BLADDER Height & Weight     Self, Time, Place, Situation  Normal Indwelling catheter (Double-lumen, tube size  16 Fr. , balloon size 10 mL) Weight:   Height:     BEHAVIORAL SYMPTOMS/MOOD NEUROLOGICAL BOWEL NUTRITION STATUS      Continent  (Please see discharge summary)  AMBULATORY STATUS COMMUNICATION OF NEEDS Skin   Maximum Assist, +2 assist Verbally                         Personal Care Assistance Level of Assistance  Bathing, Feeding, Dressing Baiting Assistance: Max assist Feeding Assistance: Limited assist Dressing Assistance: Max assist     Systems analyst, Hearing, Speech Sight Info: Adequate Hearing Info: Impaired Speech Info: Adequate    SPECIAL CARE FACTORS FREQUENCY  PT (By licensed PT), OT (By licensed OT)     PT Frequency: 5x week OT Frequency: 5x  week            Contractures Contractures Info: Not present    Additional Factors Info  Code Status, Allergies Code Status Info: Full Allergies Info: No known allergies           Current Medications (01/31/2016):  This is the current hospital active medication list Current Facility-Administered Medications  Medication Dose Route Frequency Provider Last Rate Last Dose  . 0.9 %  sodium chloride infusion   Intravenous Continuous Etta Quill, DO 75 mL/hr at 01/31/16 [id number] 75 mL/hr at 01/31/16 [id number]  . acetaminophen (TYLENOL) tablet 1,000 mg  1,000 mg Oral BID Etta Quill, DO      . atorvastatin (LIPITOR) tablet 40 mg  40 mg Oral q1800 Etta Quill, DO      . ceFAZolin (ANCEF) IVPB 2g/100 mL premix  2 g Intravenous On Call to [place], MD      . HYDROcodone-acetaminophen (NORCO/VICODIN) 5-325 MG per tablet 1-2 tablet  1-2 tablet Oral Q6H PRN Etta Quill, DO      . metoprolol (LOPRESSOR) tablet 50 mg  50 mg Oral Daily Etta Quill, DO      . morphine 2 MG/ML injection 0.5 mg  0.5 mg Intravenous Q2H PRN Etta Quill, DO      . multivitamin with minerals tablet 1 tablet  1 tablet Oral Daily Etta Quill, DO      . povidone-iodine 10 % swab  2 application  2 application Topical Once Rod Can, MD         Discharge Medications: Please see discharge summary for a list of discharge medications.  Relevant Imaging Results:  Relevant Lab Results:   Additional Information SSN: [id number]  Alla German, LCSW

## 2016-01-31 NOTE — Progress Notes (Signed)
Initial Nutrition Assessment  DOCUMENTATION CODES:   Not applicable  INTERVENTION:  Once diet advances, provide Ensure Enlive po once daily, each supplement provides 350 kcal and 20 grams of protein.  NUTRITION DIAGNOSIS:   Increased nutrient needs related to  (post op healing) as evidenced by estimated needs.  GOAL:   Patient will meet greater than or equal to 90% of their needs  MONITOR:   Diet advancement, Supplement acceptance, Labs, Weight trends, Skin, I & O's  REASON FOR ASSESSMENT:   Consult Hip fracture protocol  ASSESSMENT:   82 y.o. female with medical history significant of CVA on Plavix, WPW, HTN who presents for evaluation of R leg pain s/p mechanical fall.  Pt is currently NPO for surgery today. Pt reports eating well PTA with usual consumption of at least 3 meals a day. Pt reports no significant weight loss. RD to order Ensure to aid in healing once diet advances.   Nutrition-Focused physical exam completed. Findings are no fat depletion, mild to moderate muscle depletion, and no edema.   Labs and medications reviewed.   Diet Order:  Diet NPO time specified Except for: Ice Chips, Sips with Meds  Skin:  Reviewed, no issues  Last BM:  Unknown  Height:   Ht Readings from Last 1 Encounters:  01/30/16 4\' 9"  (1.448 m)    Weight:   Wt Readings from Last 1 Encounters:  01/30/16 106 lb (48.1 kg)    Ideal Body Weight:  43 kg  BMI:  There is no height or weight on file to calculate BMI.  Estimated Nutritional Needs:   Kcal:  1300-1500  Protein:  50-60 grams  Fluid:  >/= 1.5 L/day  EDUCATION NEEDS:   No education needs identified at this time  Corrin Parker, MS, RD, LDN Pager # [phone] After hours/ weekend pager # [phone]

## 2016-01-31 NOTE — Consult Note (Signed)
ORTHOPAEDIC CONSULTATION  REQUESTING PHYSICIAN: Ripudeep Krystal Eaton, MD  PCP:  Valera Castle, MD  Chief Complaint: Right periprosthetic distal femur fracture  HPI: [name] is a 82 y.o. female with a history of previous right total knee replacement about 13 years ago that was well-functioning until yesterday. She tripped and fell in the bathroom and injured her right femur. She was unable to weight-bear. She was taken to the emergency department at [facility]. The orthopedic surgeon on call transferred her to [facility] as the scope of this injury is outside of his practice. She was admitted to the hospitalist service. Orthopedic consultation was obtained for her right periprosthetic distal femur fracture. She does reside in the independent living facility. She denies other injuries.  Past Medical History:  Diagnosis Date  . Hypertension   . WPW (Wolff-Parkinson-White syndrome)    Past Surgical History:  Procedure Laterality Date  . MASTECTOMY    . REPLACEMENT TOTAL KNEE     Social History   Social History  . Marital status: Widowed    Spouse name: N/A  . Number of children: N/A  . Years of education: N/A   Social History Main Topics  . Smoking status: Never Smoker  . Smokeless tobacco: Never Used  . Alcohol use Not on file  . Drug use: Unknown  . Sexual activity: Not on file   Other Topics Concern  . Not on file   Social History Narrative  . No narrative on file   Family History  Problem Relation Age of Onset  . Lymphoma Brother   . CAD Brother   . Prostate cancer Brother   . Cancer Mother   . Cancer Father    No Known Allergies Prior to Admission medications   Medication Sig Start Date End Date Taking? Authorizing Provider  acetaminophen (TYLENOL) 500 MG tablet Take 1,000 mg by mouth 2 (two) times daily.    Historical Provider, MD  amLODipine (NORVASC) 5 MG tablet Take 5 mg by mouth daily.    Historical Provider, MD  atorvastatin  (LIPITOR) 40 MG tablet Take 1 tablet (40 mg total) by mouth daily at 6 PM. 11/07/15   Loletha Grayer, MD  benazepril (LOTENSIN) 40 MG tablet Take 40 mg by mouth daily.    Historical Provider, MD  clopidogrel (PLAVIX) 75 MG tablet Take 1 tablet (75 mg total) by mouth daily. 11/08/15   Loletha Grayer, MD  ibuprofen (ADVIL,MOTRIN) 200 MG tablet Take 400 mg by mouth every 6 (six) hours as needed.    Historical Provider, MD  metoprolol (LOPRESSOR) 50 MG tablet Take 50 mg by mouth daily.    Historical Provider, MD  Multiple Vitamin (MULTIVITAMIN WITH MINERALS) TABS tablet Take 1 tablet by mouth daily.    Historical Provider, MD   Dg Pelvis 1-2 Views  Result Date: 01/30/2016 CLINICAL DATA:  Fall today at nursing home. RIGHT distal femur bruising. EXAM: PELVIS - 1-2 VIEW COMPARISON:  Pelvic radiograph January 20, 2012 FINDINGS: There is no evidence of pelvic fracture or diastasis. No pelvic bone lesions are seen. Osteopenia. Severe degenerative change of the included cervical spine. IMPRESSION: Negative. Electronically Signed   By: Elon Alas M.D.   On: 01/30/2016 22:43   Ct Head Wo Contrast  Result Date: 01/30/2016 CLINICAL DATA:  Fall today at nursing home. Distal femur bruising and RIGHT leg pain. History of hypertension. EXAM: CT HEAD WITHOUT CONTRAST TECHNIQUE: Contiguous axial images were obtained from the base of the skull through  the vertex without intravenous contrast. COMPARISON:  MRI head November 07, 2015. FINDINGS: BRAIN: The ventricles and sulci are normal for age. No intraparenchymal hemorrhage, mass effect nor midline shift. Patchy supratentorial white matter hypodensities less than expected for patient's age, though non-specific are most compatible with chronic small vessel ischemic disease. No acute large vascular territory infarcts. Old LEFT thalamus lacunar infarcts. No abnormal extra-axial fluid collections. Basal cisterns are patent. VASCULAR: Mild calcific atherosclerosis of the  carotid siphons. SKULL: No skull fracture. No significant scalp soft tissue swelling. SINUSES/ORBITS: The mastoid air-cells and included paranasal sinuses are well-aerated.The included ocular globes and orbital contents are non-suspicious. OTHER: None. IMPRESSION: No acute intracranial process. Stable examination including old LEFT thalamus lacunar infarct and mild to moderate chronic small vessel ischemic disease. Electronically Signed   By: Elon Alas M.D.   On: 01/30/2016 22:46   Ct Knee Right Wo Contrast  Result Date: 01/31/2016 CLINICAL DATA:  82 year old female with right femoral fracture. Right knee arthroplasty periods EXAM: CT OF THE right KNEE WITHOUT CONTRAST TECHNIQUE: Multidetector CT imaging of the right knee was performed according to the standard protocol. Multiplanar CT image reconstructions were also generated. COMPARISON:  Right femoral radiograph dated 01/30/2016 FINDINGS: Bones/Joint/Cartilage There is a comminuted and displaced oblique fracture of the distal third of the right femur. There is approximately 2 cm anterior displacement of the largest distal fracture fragment. There is lateral displacement and posterior angulation of the distal femur and knee in relation to the femoral shaft. There is approximately 3.5 cm overlap. The fractures extend into the distal femur and to the arthroplasty. Evaluation however is limited due to streak artifact caused by metallic knee arthroplasty. There is apparent marrow edema and hematoma at the fracture site.  A There is a total knee arthroplasty. The arthroplasty components appear intact and in anatomic alignment. The bones are osteopenic. Ligaments Suboptimally assessed by CT. Muscles and Tendons There is edema and small hematoma within the distal femoral musculature. No large fluid collections or hematoma. Soft tissues Diffuse soft tissue edema and stranding of the distal femur. IMPRESSION: Comminuted and displaced oblique fracture of the  distal femur with extension into the femoral component of the arthroplasty. A large distal fracture fragment appears anterior and laterally displaced. There is mild lateral displacement and posterior angulation of the distal femur in relation to the femoral shaft. The arthroplasty component appears intact and in anatomic alignment. Electronically Signed   By: Anner Crete M.D.   On: 01/31/2016 00:53   Dg Chest Portable 1 View  Result Date: 01/30/2016 CLINICAL DATA:  Fall today in nursing home. Preoperative evaluation. EXAM: PORTABLE CHEST 1 VIEW COMPARISON:  Chest radiograph Jul 06, 2015 FINDINGS: Cardiac silhouette is mildly enlarged, mediastinal silhouette is unremarkable, calcified aortic knob. LEFT lung base airspace opacity with hazy densities LEFT lung, LEFT greater than RIGHT apical pleural capping. RIGHT lung is clear. No pneumothorax. Osteopenia. Symmetric vascular calcifications in the neck. IMPRESSION: Stable cardiomegaly. Hazy density LEFT lung concerning for layering pleural effusion with LEFT lung base airspace opacity. Recommend follow-up PA and lateral views the chest when clinically able. Electronically Signed   By: Elon Alas M.D.   On: 01/30/2016 22:51   Dg Femur, Min 2 Views Right  Result Date: 01/30/2016 CLINICAL DATA:  82 year old female with fall and right femoral pain. EXAM: RIGHT FEMUR 2 VIEWS COMPARISON:  None. FINDINGS: There is a displaced oblique and comminuted fracture of the distal femur. There is extension of the fracture to the femoral  component of the knee arthroplasty. There is posterior angulation and displacement of the main distal fracture fragment in relation to the femoral shaft. The bones are osteopenic. There is a total right knee arthroplasty. The arthroplasty components appear intact and in anatomic alignment. The soft tissues are grossly unremarkable. No significant joint effusion identified, however evaluation for the suprapatellar joint is somewhat  limited due to superimposition of the fracture fragments on the cross-table lateral view. IMPRESSION: Comminuted and displaced oblique fracture of the distal femur with extension of the fracture to the femoral component of the arthroplasty. Orthopedic consult is advised. Electronically Signed   By: Anner Crete M.D.   On: 01/30/2016 22:47    Positive ROS: All other systems have been reviewed and were otherwise negative with the exception of those mentioned in the HPI and as above.  Physical Exam: General: Alert, no acute distress Cardiovascular: No pedal edema Respiratory: No cyanosis, no use of accessory musculature GI: No organomegaly, abdomen is soft and non-tender Skin: No lesions in the area of chief complaint Neurologic: Sensation intact distally Psychiatric: Patient is competent for consent with normal mood and affect Lymphatic: No axillary or cervical lymphadenopathy  MUSCULOSKELETAL: Examination of the right lower extremity reveals a healed anterior midline incision over the knee. Her extremity is shortened and externally rotated. She has pain with attempted logrolling. She is neurovascularly intact distally.  Assessment: Periprosthetic right distal femur fracture  Plan: I discussed the findings with the patient. I recommended open reduction internal fixation of her right femur. We discussed the risks, benefits, and alternatives. We will plan for surgery this evening. Continue nothing by mouth. Hold blood thinners today.   The risks, benefits, and alternatives were discussed with the patient. There are risks associated with the surgery including, but not limited to, problems with anesthesia (death), infection, differences in leg length/angulation/rotation, fracture of bones, loosening or failure of implants, malunion, nonunion, hematoma (blood accumulation) which may require surgical drainage, blood clots, pulmonary embolism, nerve injury (foot drop), and blood vessel injury. The  patient understands these risks and elects to proceed.   Evalynne Locurto, Horald Pollen, MD Cell [phone]    01/31/2016 7:51 AM

## 2016-01-31 NOTE — Anesthesia Procedure Notes (Signed)
Procedure Name: Intubation Date/Time: 01/31/2016 5:40 PM Performed by: Garrison Columbus T Pre-anesthesia Checklist: Patient identified, Emergency Drugs available, Suction available and Patient being monitored Patient Re-evaluated:Patient Re-evaluated prior to inductionOxygen Delivery Method: Circle System Utilized Preoxygenation: Pre-oxygenation with 100% oxygen Intubation Type: IV induction Ventilation: Mask ventilation without difficulty and Oral airway inserted - appropriate to patient size Laryngoscope Size: Sabra Heck and 2 Grade View: Grade I Tube type: Oral Tube size: 7.0 mm Number of attempts: 1 Airway Equipment and Method: Stylet and Oral airway Placement Confirmation: ETT inserted through vocal cords under direct vision,  positive ETCO2 and breath sounds checked- equal and bilateral Secured at: 22 cm Tube secured with: Tape Dental Injury: Teeth and Oropharynx as per pre-operative assessment

## 2016-01-31 NOTE — Progress Notes (Signed)
Blood transfusion in process from OR; Unit number [id number] n, O-pos.  Transfusion completed @2015  with out complications. 335 received. See flow sheet for vital signs.

## 2016-01-31 NOTE — Progress Notes (Signed)
Patient seen and examined, admitted by Dr. Alcario Drought this morning.  Briefly 82 year old female with hypertension, presented with mechanical fall and right femur fracture  BP 91/70 (BP Location: Right Arm)   Pulse 80   Temp 97.8 F (36.6 C) (Oral)   Resp 16   SpO2 98%   A/p - Orthopedics consulted, Dr Lyla Glassing - OR planned today  - NPO, gentle IV fluid hydration, hold anticoagulation for DVT prophylaxis today presurgery - Pain control   Primrose Oler M.D. [facility] 01/31/2016, 11:21 AM  Pager: [phone]

## 2016-01-31 NOTE — Anesthesia Postprocedure Evaluation (Signed)
Anesthesia Post Note  Patient: [name]  Procedure(s) Performed: Procedure(s) (LRB): OPEN REDUCTION INTERNAL FIXATION DISTAL FEMUR FRACTURE (Right)  Patient location during evaluation: PACU Anesthesia Type: General Level of consciousness: awake Pain management: pain level controlled Vital Signs Assessment: post-procedure vital signs reviewed and stable Respiratory status: spontaneous breathing, nonlabored ventilation and respiratory function stable Cardiovascular status: blood pressure returned to baseline Anesthetic complications: no    Last Vitals:  Vitals:   01/31/16 2100 01/31/16 2129  BP: (!) 149/57 134/67  Pulse: 93 80  Resp: 15 15  Temp:  36.6 C    Last Pain:  Vitals:   01/31/16 2129  TempSrc: Oral  PainSc:                  [name]

## 2016-01-31 NOTE — Brief Op Note (Signed)
01/31/2016  7:30 PM  PATIENT:  [name]  82 y.o. female  PRE-OPERATIVE DIAGNOSIS:  Distal femur fracture  POST-OPERATIVE DIAGNOSIS:  Distal femur fracture  PROCEDURE:  Procedure(s): OPEN REDUCTION INTERNAL FIXATION (ORIF) DISTAL FEMUR FRACTURE (Right)  SURGEON:  Surgeon(s) and Role:    * Rod Can, MD - Primary  PHYSICIAN ASSISTANT: none  ASSISTANTS: April green, rnfa   ANESTHESIA:   general  EBL:  Total I/O In: 335 [Blood:335] Out: -   BLOOD ADMINISTERED:none  DRAINS: none   LOCAL MEDICATIONS USED:  NONE  SPECIMEN:  No Specimen  DISPOSITION OF SPECIMEN:  N/A  COUNTS:  YES  TOURNIQUET:  * No tourniquets in log *  DICTATION: .Other Dictation: Dictation Number [id number]  PLAN OF CARE: Admit to inpatient   PATIENT DISPOSITION:  PACU - hemodynamically stable.   Delay start of Pharmacological VTE agent (>24hrs) due to surgical blood loss or risk of bleeding: no

## 2016-02-01 ENCOUNTER — Encounter (HOSPITAL_COMMUNITY): Payer: Self-pay | Admitting: Orthopedic Surgery

## 2016-02-01 DIAGNOSIS — S72001D Fracture of unspecified part of neck of right femur, subsequent encounter for closed fracture with routine healing: Secondary | ICD-10-CM

## 2016-02-01 LAB — CBC
HCT: 27.2 % — ABNORMAL LOW (ref 36.0–46.0)
Hemoglobin: 9.4 g/dL — ABNORMAL LOW (ref 12.0–15.0)
MCH: 30.6 pg (ref 26.0–34.0)
MCHC: 34.6 g/dL (ref 30.0–36.0)
MCV: 88.6 fL (ref 78.0–100.0)
PLATELETS: 105 10*3/uL — AB (ref 150–400)
RBC: 3.07 MIL/uL — AB (ref 3.87–5.11)
RDW: 16.3 % — AB (ref 11.5–15.5)
WBC: 6.7 10*3/uL (ref 4.0–10.5)

## 2016-02-01 LAB — BASIC METABOLIC PANEL
Anion gap: 9 (ref 5–15)
BUN: 24 mg/dL — ABNORMAL HIGH (ref 6–20)
CALCIUM: 7.8 mg/dL — AB (ref 8.9–10.3)
CO2: 18 mmol/L — ABNORMAL LOW (ref 22–32)
Chloride: 105 mmol/L (ref 101–111)
Creatinine, Ser: 0.77 mg/dL (ref 0.44–1.00)
GFR calc Af Amer: >60 mL/min (ref >60)
Glucose, Bld: 113 mg/dL — ABNORMAL HIGH (ref 65–99)
POTASSIUM: 4.3 mmol/L (ref 3.5–5.1)
SODIUM: 132 mmol/L — AB (ref 135–145)

## 2016-02-01 NOTE — Evaluation (Signed)
Physical Therapy Evaluation Patient Details Name: [name] MRN: [id number] DOB: [birth year] Today's Date: 02/01/2016   History of Present Illness   [name] is a 82 y.o. female with medical history significant of CVA on Plavix, WPW, HTN who presents for evaluation of R leg pain s/p mechanical fall.  Patient states that she fell in her closet landing on her right side. Pt s/p ORIF (01/31/16) PMH includes L mastectomy, TKR, sciatica, L LE lymphedema  Clinical Impression  Pt admitted with above diagnosis. Pt currently with functional limitations due to the deficits listed below (see PT Problem List). Pt will benefit from skilled PT to increase their independence and safety with mobility to allow discharge to the venue listed below.  Pt with decreased sitting balance and increased pain which limited evaluation.  Recommend short term SNF to work towards returning to her prior level of being independent with rollator prior to her fall.     Follow Up Recommendations SNF;Supervision for mobility/OOB    Equipment Recommendations  None recommended by PT    Recommendations for Other Services       Precautions / Restrictions Precautions Precautions: Fall Restrictions Weight Bearing Restrictions: Yes RLE Weight Bearing: Touchdown weight bearing Other Position/Activity Restrictions: 30%      Mobility  Bed Mobility Overal bed mobility: Needs Assistance Bed Mobility: Supine to Sit     Supine to sit: Max assist     General bed mobility comments: MAX A to come to EOB with use of bed pad.  While sitting, pt with tendency to extend and pt not able to bend L LE well to reposition herself. TOT A to get hips scooted back farther to prevent her from sliding off bed.  Pt reports she has fallen before sliding off bed and feel her tendency to extend has to do with this hisotry and her fear of falling.  Transfers                 General transfer comment: Unable to  attempt  Ambulation/Gait                Stairs            Wheelchair Mobility    Modified Rankin (Stroke Patients Only)       Balance Overall balance assessment: History of Falls;Needs assistance   Sitting balance-Leahy Scale: Poor   Postural control: Posterior lean                                   Pertinent Vitals/Pain Pain Assessment: Faces Faces Pain Scale: Hurts whole lot Pain Location: R hip with activity Pain Descriptors / Indicators: Grimacing Pain Intervention(s): Limited activity within patient's tolerance;Monitored during session;Premedicated before session;Repositioned;Ice applied    Home Living Family/patient expects to be discharged to:: Skilled nursing facility                 Additional Comments: Independent Living at [facility].    Prior Function Level of Independence: Independent with assistive device(s)         Comments: Independent with rollator.  Ambulated to dining room 3 meals a day plus a few walks for exercise.  Did her own laundry, meds, dressing, bathing     Hand Dominance        Extremity/Trunk Assessment   Upper Extremity Assessment Upper Extremity Assessment: Defer to OT evaluation    Lower Extremity Assessment Lower Extremity Assessment:  RLE deficits/detail RLE Deficits / Details: decreased ROM/strength RLE: Unable to fully assess due to pain       Communication      Cognition Arousal/Alertness: Awake/alert Behavior During Therapy: WFL for tasks assessed/performed Overall Cognitive Status: Within Functional Limits for tasks assessed                 General Comments: Pt awake during session, but did report feeling sleepy, possibly due to pain meds.  discussed with pt and son, pain meds and leel of alertness and therapy.    General Comments General comments (skin integrity, edema, etc.): Pt with some tremors which son stated was happening prior to admission    Exercises Total  Joint Exercises Ankle Circles/Pumps: AROM;10 reps;Both Quad Sets: Strengthening;Right;5 reps Heel Slides: AAROM;Right;5 reps   Assessment/Plan    PT Assessment Patient needs continued PT services  PT Problem List Decreased strength;Decreased range of motion;Decreased balance;Decreased activity tolerance;Decreased mobility;Pain;Decreased knowledge of precautions;Decreased knowledge of use of DME          PT Treatment Interventions DME instruction;Gait training;Functional mobility training;Therapeutic activities;Therapeutic exercise;Patient/family education    PT Goals (Current goals can be found in the Care Plan section)  Acute Rehab PT Goals Patient Stated Goal: to go to rehab PT Goal Formulation: With patient/family Time For Goal Achievement: 02/08/16 Potential to Achieve Goals: Fair    Frequency Min 3X/week   Barriers to discharge        Co-evaluation               End of Session Equipment Utilized During Treatment: Gait belt Activity Tolerance: Patient limited by pain;Patient limited by fatigue Patient left: in bed;with call bell/phone within reach;with family/visitor present Nurse Communication: Mobility status;Weight bearing status (nurse tech)         Time: [id number] PT Time Calculation (min) (ACUTE ONLY): 42 min   Charges:   PT Evaluation $PT Eval Moderate Complexity: 1 Procedure PT Treatments $Therapeutic Exercise: 8-22 mins $Therapeutic Activity: 8-22 mins   PT G Codes:        Jalayne Ganesh LUBECK 02/01/2016, 10:40 AM

## 2016-02-01 NOTE — Care Management Note (Signed)
Case Management Note  Patient Details  Name: [name] MRN: [id number] Date of Birth: April 04, 1923  Subjective/Objective:   Pt Presented for Closed Fracture of R distal femur- Post op day 1. Pt was from [facility]. Pt is agreeable to SNF placement @ the same facility.                  Action/Plan: CSW assisting with disposition needs. No further needs from CM at this time.   Expected Discharge Date:                  Expected Discharge Plan:  Skilled Nursing Facility  In-House Referral:  Clinical Social Work  Discharge planning Services  CM Consult  Post Acute Care Choice:  NA Choice offered to:  NA  DME Arranged:  N/A DME Agency:  NA  HH Arranged:  NA HH Agency:  NA  Status of Service:  Completed, signed off  If discussed at [place] of Stay Meetings, dates discussed:    Additional Comments:  Bethena Roys, RN 02/01/2016, 10:27 AM

## 2016-02-01 NOTE — Progress Notes (Signed)
PROGRESS NOTE    [name]  [id number] DOB: [birth year] DOA: 01/31/2016 PCP: Valera Castle, MD    Brief Narrative: [name] is a 82 y.o. female with medical history significant of CVA on Plavix, WPW, HTN who presents for evaluation of R leg pain s/p mechanical fall.  She was found to have Closed fracture of R distal femur -ortho consulted and she underwent Open reduction and internal fixation, right femur on 2/14.    Assessment & Plan:   Principal Problem:   Closed fracture of right distal femur ([place]) Active Problems:   Essential hypertension   Hyponatremia   HLD (hyperlipidemia)   Normocytic normochromic anemia   History of stroke   Hip fracture (HCC)   Peri-prosthetic supracondylar fracture of femur   Open reduction and internal fixation, right femur:  Underwent srugical repair.  Pain control and PT.    Hyponatremia: improved with hydration.    Hypertension controlled.   H/o CVA; off plavix today.    Normocytic anemia:  Monitor.      DVT prophylaxis: lovenox Code Status: (Full/ Family Communication: family at bedside.  Disposition Plan: pending PT eval.    Consultants:   Orthopedics.    Procedures: ORIF on 12/14   Antimicrobials:one does of cefazolin.    Subjective: Pain controlled.   Objective: Vitals:   01/31/16 2129 02/01/16 0015 02/01/16 0445 02/01/16 1400  BP: 134/67 132/84 (!) 147/56 (!) 165/49  Pulse: 80 95 87 95  Resp: 15 15 16 16   Temp: 97.8 F (36.6 C) 98.5 F (36.9 C) 99.2 F (37.3 C) 99.3 F (37.4 C)  TempSrc: Oral Oral Oral Oral  SpO2: 100% 100% 100% 95%    Intake/Output Summary (Last 24 hours) at 02/01/16 1856 Last data filed at 02/01/16 1730  Gross per 24 hour  Intake          3586.25 ml  Output              552 ml  Net          3034.25 ml   There were no vitals filed for this visit.  Examination:  General exam: Appears calm and comfortable  Respiratory system: Clear to auscultation.  Respiratory effort normal. Cardiovascular system: S1 & S2 heard, RRR. No JVD, murmurs, rubs, gallops or clicks. No pedal edema. Gastrointestinal system: Abdomen is nondistended, soft and nontender. No organomegaly or masses felt. Normal bowel sounds heard. Central nervous system: Alert and oriented. No focal neurological deficits. Extremities: tender right lower extremity.  Skin: No rashes, lesions or ulcers     Data Reviewed: I have personally reviewed following labs and imaging studies  CBC:  Recent Labs Lab 01/30/16 2352 01/31/16 0605 02/01/16 0513  WBC 13.6* 10.1 6.7  HGB 9.8* 8.0* 9.4*  HCT 28.3* 23.3* 27.2*  MCV 94.2 93.2 88.6  PLT 227 158 [id number]*   Basic Metabolic Panel:  Recent Labs Lab 01/30/16 2352 01/31/16 0605 02/01/16 0513  NA 128* 131* 132*  K 4.5 4.6 4.3  CL 98* 101 105  CO2 25 23 18*  GLUCOSE 162* 157* 113*  BUN 29* 28* 24*  CREATININE 0.78 0.91 0.77  CALCIUM 8.5* 8.7* 7.8*   GFR: Estimated Creatinine Clearance: 31.3 mL/min (by C-G formula based on SCr of 0.77 mg/dL). Liver Function Tests: No results for input(s): AST, ALT, ALKPHOS, BILITOT, PROT, ALBUMIN in the last 168 hours. No results for input(s): LIPASE, AMYLASE in the last 168 hours. No results for input(s): AMMONIA in the last  168 hours. Coagulation Profile:  Recent Labs Lab 01/30/16 2352  INR 1.13   Cardiac Enzymes: No results for input(s): CKTOTAL, CKMB, CKMBINDEX, TROPONINI in the last 168 hours. BNP (last 3 results) No results for input(s): PROBNP in the last 8760 hours. HbA1C: No results for input(s): HGBA1C in the last 72 hours. CBG: No results for input(s): GLUCAP in the last 168 hours. Lipid Profile: No results for input(s): CHOL, HDL, LDLCALC, TRIG, CHOLHDL, LDLDIRECT in the last 72 hours. Thyroid Function Tests: No results for input(s): TSH, T4TOTAL, FREET4, T3FREE, THYROIDAB in the last 72 hours. Anemia Panel: No results for input(s): VITAMINB12, FOLATE, FERRITIN,  TIBC, IRON, RETICCTPCT in the last 72 hours. Sepsis Labs: No results for input(s): PROCALCITON, LATICACIDVEN in the last 168 hours.  Recent Results (from the past 240 hour(s))  MRSA PCR Screening     Status: None   Collection Time: 01/31/16  7:25 AM  Result Value Ref Range Status   MRSA by PCR NEGATIVE NEGATIVE Final    Comment:        The GeneXpert MRSA Assay (FDA approved for NASAL specimens only), is one component of a comprehensive MRSA colonization surveillance program. It is not intended to diagnose MRSA infection nor to guide or monitor treatment for MRSA infections.          Radiology Studies: Dg Pelvis 1-2 Views  Result Date: 01/30/2016 CLINICAL DATA:  Fall today at nursing home. RIGHT distal femur bruising. EXAM: PELVIS - 1-2 VIEW COMPARISON:  Pelvic radiograph January 20, 2012 FINDINGS: There is no evidence of pelvic fracture or diastasis. No pelvic bone lesions are seen. Osteopenia. Severe degenerative change of the included cervical spine. IMPRESSION: Negative. Electronically Signed   By: Elon Alas M.D.   On: 01/30/2016 22:43   Ct Head Wo Contrast  Result Date: 01/30/2016 CLINICAL DATA:  Fall today at nursing home. Distal femur bruising and RIGHT leg pain. History of hypertension. EXAM: CT HEAD WITHOUT CONTRAST TECHNIQUE: Contiguous axial images were obtained from the base of the skull through the vertex without intravenous contrast. COMPARISON:  MRI head November 07, 2015. FINDINGS: BRAIN: The ventricles and sulci are normal for age. No intraparenchymal hemorrhage, mass effect nor midline shift. Patchy supratentorial white matter hypodensities less than expected for patient's age, though non-specific are most compatible with chronic small vessel ischemic disease. No acute large vascular territory infarcts. Old LEFT thalamus lacunar infarcts. No abnormal extra-axial fluid collections. Basal cisterns are patent. VASCULAR: Mild calcific atherosclerosis of the  carotid siphons. SKULL: No skull fracture. No significant scalp soft tissue swelling. SINUSES/ORBITS: The mastoid air-cells and included paranasal sinuses are well-aerated.The included ocular globes and orbital contents are non-suspicious. OTHER: None. IMPRESSION: No acute intracranial process. Stable examination including old LEFT thalamus lacunar infarct and mild to moderate chronic small vessel ischemic disease. Electronically Signed   By: Elon Alas M.D.   On: 01/30/2016 22:46   Ct Knee Right Wo Contrast  Result Date: 01/31/2016 CLINICAL DATA:  82 year old female with right femoral fracture. Right knee arthroplasty periods EXAM: CT OF THE right KNEE WITHOUT CONTRAST TECHNIQUE: Multidetector CT imaging of the right knee was performed according to the standard protocol. Multiplanar CT image reconstructions were also generated. COMPARISON:  Right femoral radiograph dated 01/30/2016 FINDINGS: Bones/Joint/Cartilage There is a comminuted and displaced oblique fracture of the distal third of the right femur. There is approximately 2 cm anterior displacement of the largest distal fracture fragment. There is lateral displacement and posterior angulation of the distal femur and  knee in relation to the femoral shaft. There is approximately 3.5 cm overlap. The fractures extend into the distal femur and to the arthroplasty. Evaluation however is limited due to streak artifact caused by metallic knee arthroplasty. There is apparent marrow edema and hematoma at the fracture site.  A There is a total knee arthroplasty. The arthroplasty components appear intact and in anatomic alignment. The bones are osteopenic. Ligaments Suboptimally assessed by CT. Muscles and Tendons There is edema and small hematoma within the distal femoral musculature. No large fluid collections or hematoma. Soft tissues Diffuse soft tissue edema and stranding of the distal femur. IMPRESSION: Comminuted and displaced oblique fracture of the  distal femur with extension into the femoral component of the arthroplasty. A large distal fracture fragment appears anterior and laterally displaced. There is mild lateral displacement and posterior angulation of the distal femur in relation to the femoral shaft. The arthroplasty component appears intact and in anatomic alignment. Electronically Signed   By: Anner Crete M.D.   On: 01/31/2016 00:53   Dg Chest Portable 1 View  Result Date: 01/30/2016 CLINICAL DATA:  Fall today in nursing home. Preoperative evaluation. EXAM: PORTABLE CHEST 1 VIEW COMPARISON:  Chest radiograph Jul 06, 2015 FINDINGS: Cardiac silhouette is mildly enlarged, mediastinal silhouette is unremarkable, calcified aortic knob. LEFT lung base airspace opacity with hazy densities LEFT lung, LEFT greater than RIGHT apical pleural capping. RIGHT lung is clear. No pneumothorax. Osteopenia. Symmetric vascular calcifications in the neck. IMPRESSION: Stable cardiomegaly. Hazy density LEFT lung concerning for layering pleural effusion with LEFT lung base airspace opacity. Recommend follow-up PA and lateral views the chest when clinically able. Electronically Signed   By: Elon Alas M.D.   On: 01/30/2016 22:51   Dg C-arm 1-60 Min  Result Date: 01/31/2016 CLINICAL DATA:  ORIF distal right femur EXAM: DG C-ARM 61-120 MIN; RIGHT FEMUR 2 VIEWS COMPARISON:  Radiographs 01/30/2016 FINDINGS: Five low resolution intraoperative spot films of the right femur were obtained. Total fluoroscopy time was 2 minutes 36 seconds. A prior right knee replacement is noted. The images demonstrate surgical plate multiple screw fixation of the distal right femoral fracture with anatomic alignment. Cerclage wires also noted at the distal femur. IMPRESSION: Intraoperative fluoroscopic assistance provided during ORIF of distal right femoral fracture. Electronically Signed   By: Donavan Foil M.D.   On: 01/31/2016 19:48   Dg Femur, Min 2 Views Right  Result  Date: 01/31/2016 CLINICAL DATA:  ORIF distal right femur EXAM: DG C-ARM 61-120 MIN; RIGHT FEMUR 2 VIEWS COMPARISON:  Radiographs 01/30/2016 FINDINGS: Five low resolution intraoperative spot films of the right femur were obtained. Total fluoroscopy time was 2 minutes 36 seconds. A prior right knee replacement is noted. The images demonstrate surgical plate multiple screw fixation of the distal right femoral fracture with anatomic alignment. Cerclage wires also noted at the distal femur. IMPRESSION: Intraoperative fluoroscopic assistance provided during ORIF of distal right femoral fracture. Electronically Signed   By: Donavan Foil M.D.   On: 01/31/2016 19:48   Dg Femur, Min 2 Views Right  Result Date: 01/30/2016 CLINICAL DATA:  82 year old female with fall and right femoral pain. EXAM: RIGHT FEMUR 2 VIEWS COMPARISON:  None. FINDINGS: There is a displaced oblique and comminuted fracture of the distal femur. There is extension of the fracture to the femoral component of the knee arthroplasty. There is posterior angulation and displacement of the main distal fracture fragment in relation to the femoral shaft. The bones are osteopenic. There is a  total right knee arthroplasty. The arthroplasty components appear intact and in anatomic alignment. The soft tissues are grossly unremarkable. No significant joint effusion identified, however evaluation for the suprapatellar joint is somewhat limited due to superimposition of the fracture fragments on the cross-table lateral view. IMPRESSION: Comminuted and displaced oblique fracture of the distal femur with extension of the fracture to the femoral component of the arthroplasty. Orthopedic consult is advised. Electronically Signed   By: Anner Crete M.D.   On: 01/30/2016 22:47   Dg Femur [place] 2 Views Right  Result Date: 01/31/2016 CLINICAL DATA:  82 year old female with a history of fracture EXAM: RIGHT FEMUR PORTABLE 2 VIEW COMPARISON:  01/30/2016  FINDINGS: Postoperative changes of open reduction internal fixation of previous right femur fracture, with new lateral plate and screw buttress fixation with cerclage wire. Anatomic alignment has been restored post fixation. Small amount of gas within the surgical bed. Right hip appears located. Re- demonstration of prior right knee arthroplasty. IMPRESSION: Early postoperative changes of ORIF of right distal femur fracture with plate and screw buttress fixation with cerclage wires, and restoration of anatomic alignment. Re- demonstration of prior surgical changes of right knee arthroplasty. Signed, Dulcy Fanny. Earleen Newport, DO [facility] Electronically Signed   By: Corrie Mckusick D.O.   On: 01/31/2016 20:54        Scheduled Meds: . atorvastatin  40 mg Oral q1800  . docusate sodium  100 mg Oral BID  . enoxaparin (LOVENOX) injection  40 mg Subcutaneous Q24H  . feeding supplement (ENSURE ENLIVE)  237 mL Oral Q1500  . metoprolol  50 mg Oral Daily  . multivitamin with minerals  1 tablet Oral Daily  . senna  1 tablet Oral BID   Continuous Infusions: . sodium chloride 75 mL/hr at 02/01/16 0843     LOS: 1 day    Time spent: 66 min    Eilidh Marcano, MD [facility] Pager [phone]  If 7PM-7AM, please contact night-coverage [email or web address] Password [facility] 02/01/2016, 6:56 PM

## 2016-02-01 NOTE — Progress Notes (Signed)
   Subjective:  Patient reports pain as mild to moderate.  No c/o.  Objective:   VITALS:   Vitals:   01/31/16 2100 01/31/16 2129 02/01/16 0015 02/01/16 0445  BP: (!) 149/57 134/67 132/84 (!) 147/56  Pulse: 93 80 95 87  Resp: 15 15 15 16   Temp:  97.8 F (36.6 C) 98.5 F (36.9 C) 99.2 F (37.3 C)  TempSrc:  Oral Oral Oral  SpO2: 100% 100% 100% 100%   NAD ABD soft Sensation intact distally Intact pulses distally Dorsiflexion/Plantar flexion intact Incision: dressing C/D/I Compartment soft   Lab Results  Component Value Date   WBC 6.7 02/01/2016   HGB 9.4 (L) 02/01/2016   HCT 27.2 (L) 02/01/2016   MCV 88.6 02/01/2016   PLT PENDING 02/01/2016   BMET    Component Value Date/Time   NA 132 (L) 02/01/2016 0513   NA 132 (L) 05/14/2012 1800   K 4.3 02/01/2016 0513   K 4.2 05/14/2012 1800   CL 105 02/01/2016 0513   CL 97 (L) 05/14/2012 1800   CO2 18 (L) 02/01/2016 0513   CO2 26 05/14/2012 1800   GLUCOSE 113 (H) 02/01/2016 0513   GLUCOSE 100 (H) 05/14/2012 1800   BUN 24 (H) 02/01/2016 0513   BUN 16 05/14/2012 1800   CREATININE 0.77 02/01/2016 0513   CREATININE 0.72 05/14/2012 1800   CALCIUM 7.8 (L) 02/01/2016 0513   CALCIUM 9.0 05/14/2012 1800   GFRNONAA >60 02/01/2016 0513   GFRNONAA >60 05/14/2012 1800   GFRAA >60 02/01/2016 0513   GFRAA >60 05/14/2012 1800     Assessment/Plan: 1 Day Post-Op   Principal Problem:   Closed fracture of right distal femur (HCC) Active Problems:   Essential hypertension   Hyponatremia   HLD (hyperlipidemia)   Normocytic normochromic anemia   History of stroke   Hip fracture (HCC)   Peri-prosthetic supracondylar fracture of femur   TDWB RLE PT/OT for bed --> chair xfers, ROM R knee DVT ppx: lovenox x30 days, SCDs, TEDs PO pain control D/C planning, SNF placement   Gradie Ohm, Horald Pollen 02/01/2016, 7:48 AM   Rod Can, MD Cell [phone]

## 2016-02-01 NOTE — Clinical Social Work Placement (Addendum)
   CLINICAL SOCIAL WORK PLACEMENT  NOTE  Date:  02/01/2016  Patient Details  Name: [name] MRN: [id number] Date of Birth: [birth year]  Clinical Social Work is seeking post-discharge placement for this patient at the [place] level of care (*CSW will initial, date and re-position this form in  chart as items are completed):      Patient/family provided with [place] Work Department's list of facilities offering this level of care within the geographic area requested by the patient (or if unable, by the patient's family).  Yes   Patient/family informed of their freedom to choose among providers that offer the needed level of care, that participate in Medicare, Medicaid or managed care program needed by the patient, have an available bed and are willing to accept the patient.      Patient/family informed of [facility]'s ownership interest in [facility] and [facility], as well as of the fact that they are under no obligation to receive care at these facilities.  PASRR submitted to EDS on       PASRR number received on       Existing PASRR number confirmed on 01/31/16     FL2 transmitted to all facilities in geographic area requested by pt/family on 01/31/16     FL2 transmitted to all facilities within larger geographic area on       Patient informed that his/her managed care company has contracts with or will negotiate with certain facilities, including the following:        Yes   Patient/family informed of bed offers received.  Patient chooses bed at [facility]     Physician recommends and patient chooses bed at      Patient to be transferred to [facility] on 02/05/16.  Patient to be transferred to facility by [facility]     Patient family notified on 02/05/16 of transfer.  Name of family member notified:  [name]     PHYSICIAN Please prepare prescriptions, Please prepare priority discharge summary,  including medications, Please sign FL2     Additional Comment:    _______________________________________________ Alla German, LCSW 02/01/2016, 3:41 PM

## 2016-02-01 NOTE — Op Note (Signed)
[name], [name]                   ACCOUNT NO.:  [id number]  MEDICAL RECORD NO.:  [id number]  LOCATION:  [id number]                        FACILITY:  [facility]  PHYSICIAN:  Rod Can, MD     DATE OF BIRTH:  1923-10-06  DATE OF PROCEDURE:  01/31/2016 DATE OF DISCHARGE:                              OPERATIVE REPORT   SURGEON:  Rod Can, MD  PREOPERATIVE DIAGNOSIS:  Right periprosthetic distal femur fracture.  POSTOPERATIVE DIAGNOSIS:  Right periprosthetic distal femur fracture.  PROCEDURE PERFORMED:  Open reduction and internal fixation, right femur.  IMPLANTS:  Zimmer NCB periprosthetic distal femoral locking plate 15 holes with 5 mm screws.  ANESTHESIA:  General.  EBL:  200 mL.  COMPLICATIONS:  None.  TUBES AND DRAINS:  None.  DISPOSITION:  Stable to PACU.  INDICATIONS:  The patient is a 82 year old female, history of previous right total knee replacement about 13 years ago.  She was doing well until she tripped and fell at home yesterday.  She had right thigh pain and inability to weight bear.  She was taken to [facility], where x-rays revealed a periprosthetic distal femur fracture.  Orthopedic surgeon on-call deemed that this was out of the scope of his practice and asked me to assume care of the patient.  She was therefore transferred to [facility], admitted to the hospitalist. Perioperative risk stratification, medical optimization were done.  She elected to proceed after we discussed the risks, benefits, alternatives to surgery.  DESCRIPTION OF PROCEDURE IN DETAIL:  The patient was identified in the preop holding area using 2 identifiers.  Surgical site was marked by myself.  She was taken to the operating room.  General anesthesia was induced.  She was transferred to the operating room table.  All bony prominences were well padded.  Right lower extremity was prepped and draped in normal sterile surgical fashion.  Time-out was called verifying  side and site of surgery.  I began by using fluoroscopy to define her laterally based incision.  I made an incision over the distal femur laterally.  Blunt dissection was performed until I reached the IT band.  I split the IT band in line with fibers.  I identified the vastus lateralis.  I used a Cobb elevator to subperiosteally develop a plane. The fracture was then reduced.  Under fluoroscopy, I held the reduction with a clamp.  I placed 2 sets of double 18-gauge wires subperiosteally to help hold the reduction.  I then selected a 15-hole plate, this was slid into place subperiosteally.  I pinned the plate in place proximally and distally.  I placed 5 bicortical screws in the distal articular block.  I placed 4 bicortical screws in the shaft.  I then converted the distal screws with caps into locking screws.  Final AP and lateral fluoroscopy views were used to confirm fracture reduction and hardware placement.  Reduction was near anatomic.  Wound was copiously irrigated with saline.  I closed the IT band with #1 Vicryl and 0 V-Loc.  Deep dermal layer with 2-0 interrupted Monocryl, skin with 2-0 nylon mattress technique.  Bulky sterile dressing was applied.  The patient was extubated  and taken to PACU in stable condition.  Sponge, needle, and instrument counts were correct at the end of the case x2.  There were no known complications.  Postoperatively, we will readmit the patient to the hospitalist.  She will be touchdown weightbearing right lower extremity.  She will have physical therapy for bed-to-chair transfers and range of motion of the right knee.  We will place her on Lovenox for DVT prophylaxis.  She will need disposition planning.  I will see her in the office 2 weeks after discharge.          ______________________________ Rod Can, MD     BS/MEDQ  D:  01/31/2016  T:  02/01/2016  Job:  [id number]

## 2016-02-01 NOTE — Evaluation (Signed)
Occupational Therapy Evaluation Patient Details Name: [name] MRN: [id number] DOB: [birth year] Today's Date: 02/01/2016    History of Present Illness  [name] is a 82 y.o. female with medical history significant of CVA on Plavix, WPW, HTN who presents for evaluation of R leg pain s/p mechanical fall.  Patient states that she fell in her closet landing on her right side. Pt s/p ORIF (01/31/16) PMH includes L mastectomy, TKR, sciatica, L LE lymphedema   Clinical Impression   Pt admitted with above. She demonstrates the below listed deficits and requires min - total A for ADLs and max A +2 functional transfers.  She will need SNF level rehab at discharge and all further OT needs can be met at SNF - acute OT will sign off at this time.       Follow Up Recommendations  SNF;Supervision/Assistance - 24 hour    Equipment Recommendations  None recommended by OT    Recommendations for Other Services       Precautions / Restrictions Precautions Precautions: Fall Restrictions Weight Bearing Restrictions: Yes RLE Weight Bearing: Touchdown weight bearing Other Position/Activity Restrictions: 30%      Mobility Bed Mobility Overal bed mobility: Needs Assistance Bed Mobility: Supine to Sit     Supine to sit: Mod assist     General bed mobility comments: Pt requires max cues.  She requires increased time to complete task, but is able to assist with scooting hips, sliding Rt LE toward EOB adn with lifting trunk from bed   Transfers Overall transfer level: Needs assistance   Transfers: Sit to/from Stand;Stand Pivot Transfers Sit to Stand: Max assist;+2 physical assistance Stand pivot transfers: Max assist;+2 safety/equipment       General transfer comment: Pt wtih heavy posterior lean.  Requires cues and facilitation to flex hips and knees     Balance Overall balance assessment: Needs assistance Sitting-balance support: Feet supported;Bilateral upper extremity  supported Sitting balance-Leahy Scale: Poor Sitting balance - Comments: requires mod A to maintain EOB sitting - heavy posterior lean  Postural control: Posterior lean Standing balance support: Bilateral upper extremity supported Standing balance-Leahy Scale: Poor Standing balance comment: requires max A                             ADL Overall ADL's : Needs assistance/impaired Eating/Feeding: Set up   Grooming: Wash/dry hands;Wash/dry face;Oral care;Brushing hair;Set up;Sitting   Upper Body Bathing: Set up;Sitting   Lower Body Bathing: Maximal assistance;Sit to/from stand;+2 for physical assistance   Upper Body Dressing : Minimal assistance;Sitting   Lower Body Dressing: Total assistance;Sit to/from stand   Toilet Transfer: Maximal assistance;+2 for safety/equipment;BSC   Toileting- Clothing Manipulation and Hygiene: Total assistance;Sit to/from stand;Bed level Toileting - Clothing Manipulation Details (indicate cue type and reason): Pt with urinary urgency and incontinence      Functional mobility during ADLs: Maximal assistance;+2 for physical assistance General ADL Comments: son and daughter in law present      Vision     Perception     Praxis      Pertinent Vitals/Pain Pain Assessment: 0-10 Pain Score: 5  Pain Location: R hip with activity Pain Descriptors / Indicators: Grimacing;Guarding Pain Intervention(s): Monitored during session;Patient requesting pain meds-RN notified     Hand Dominance     Extremity/Trunk Assessment Upper Extremity Assessment Upper Extremity Assessment: Generalized weakness   Lower Extremity Assessment Lower Extremity Assessment: Defer to PT evaluation  Cervical / Trunk Assessment Cervical / Trunk Assessment: Kyphotic   Communication Communication Communication: HOH   Cognition Arousal/Alertness: Awake/alert Behavior During Therapy: WFL for tasks assessed/performed;Anxious Overall Cognitive Status: Within  Functional Limits for tasks assessed                     General Comments       Exercises       Shoulder Instructions      Home Living Family/patient expects to be discharged to:: Skilled nursing facility                                 Additional Comments: Independent Living at [facility].      Prior Functioning/Environment Level of Independence: Independent with assistive device(s)        Comments: Independent with rollator.  Ambulated to dining room 3 meals a day plus a few walks for exercise.  Did her own laundry, meds, dressing, bathing        OT Problem List: Decreased strength;Decreased activity tolerance;Impaired balance (sitting and/or standing);Decreased knowledge of use of DME or AE;Decreased knowledge of precautions;Pain   OT Treatment/Interventions:      OT Goals(Current goals can be found in the care plan section) Acute Rehab OT Goals Patient Stated Goal: to go to rehab OT Goal Formulation: All assessment and education complete, DC therapy  OT Frequency:     Barriers to D/C:            Co-evaluation              End of Session Equipment Utilized During Treatment: Gait belt Nurse Communication: Mobility status;Patient requests pain meds  Activity Tolerance: Patient tolerated treatment well Patient left: in chair;with call bell/phone within reach;with family/visitor present;with nursing/sitter in room   Time: 3143-8887 OT Time Calculation (min): 24 min Charges:  OT General Charges $OT Visit: 1 Procedure OT Evaluation $OT Eval Low Complexity: 1 Procedure OT Treatments $Therapeutic Activity: 8-22 mins G-Codes:    Aeris Hersman M 02/20/16, 4:35 PM

## 2016-02-01 NOTE — Clinical Social Work Note (Signed)
CSW spoke with [name] at [facility] and pt can return and go to their SNF once medically stable for d/c. CSW notified pt's son.  [address], [place]

## 2016-02-02 ENCOUNTER — Inpatient Hospital Stay (HOSPITAL_COMMUNITY): Payer: Medicare Other

## 2016-02-02 LAB — BASIC METABOLIC PANEL
Anion gap: 3 — ABNORMAL LOW (ref 5–15)
BUN: 19 mg/dL (ref 6–20)
CO2: 24 mmol/L (ref 22–32)
CREATININE: 0.76 mg/dL (ref 0.44–1.00)
Calcium: 7.7 mg/dL — ABNORMAL LOW (ref 8.9–10.3)
Chloride: 105 mmol/L (ref 101–111)
GFR calc Af Amer: >60 mL/min (ref >60)
GFR calc non Af Amer: >60 mL/min (ref >60)
GLUCOSE: 112 mg/dL — AB (ref 65–99)
POTASSIUM: 4.3 mmol/L (ref 3.5–5.1)
Sodium: 132 mmol/L — ABNORMAL LOW (ref 135–145)

## 2016-02-02 LAB — CBC
HCT: 22.7 % — ABNORMAL LOW (ref 36.0–46.0)
Hemoglobin: 7.9 g/dL — ABNORMAL LOW (ref 12.0–15.0)
MCH: 30.6 pg (ref 26.0–34.0)
MCHC: 34.8 g/dL (ref 30.0–36.0)
MCV: 88 fL (ref 78.0–100.0)
PLATELETS: 101 10*3/uL — AB (ref 150–400)
RBC: 2.58 MIL/uL — ABNORMAL LOW (ref 3.87–5.11)
RDW: 16.5 % — ABNORMAL HIGH (ref 11.5–15.5)
WBC: 6.1 10*3/uL (ref 4.0–10.5)

## 2016-02-02 NOTE — Progress Notes (Signed)
   Subjective:  Patient reports pain as mild to moderate.  No c/o.  Objective:   VITALS:   Vitals:   02/01/16 0445 02/01/16 1400 02/01/16 1955 02/02/16 0445  BP: (!) 147/56 (!) 165/49 (!) 123/43 (!) 146/59  Pulse: 87 95 79 98  Resp: 16 16 16 16   Temp: 99.2 F (37.3 C) 99.3 F (37.4 C) 98.2 F (36.8 C) (!) 101.3 F (38.5 C)  TempSrc: Oral Oral Oral Oral  SpO2: 100% 95% 95% 93%   NAD ABD soft Sensation intact distally Intact pulses distally Dorsiflexion/Plantar flexion intact Incision: dressing C/D/I Compartment soft   Lab Results  Component Value Date   WBC 6.1 02/02/2016   HGB 7.9 (L) 02/02/2016   HCT 22.7 (L) 02/02/2016   MCV 88.0 02/02/2016   PLT 101 (L) 02/02/2016   BMET    Component Value Date/Time   NA 132 (L) 02/02/2016 0427   NA 132 (L) 05/14/2012 1800   K 4.3 02/02/2016 0427   K 4.2 05/14/2012 1800   CL 105 02/02/2016 0427   CL 97 (L) 05/14/2012 1800   CO2 24 02/02/2016 0427   CO2 26 05/14/2012 1800   GLUCOSE 112 (H) 02/02/2016 0427   GLUCOSE 100 (H) 05/14/2012 1800   BUN 19 02/02/2016 0427   BUN 16 05/14/2012 1800   CREATININE 0.76 02/02/2016 0427   CREATININE 0.72 05/14/2012 1800   CALCIUM 7.7 (L) 02/02/2016 0427   CALCIUM 9.0 05/14/2012 1800   GFRNONAA >60 02/02/2016 0427   GFRNONAA >60 05/14/2012 1800   GFRAA >60 02/02/2016 0427   GFRAA >60 05/14/2012 1800     Assessment/Plan: 2 Days Post-Op   Principal Problem:   Closed fracture of right distal femur (HCC) Active Problems:   Essential hypertension   Hyponatremia   HLD (hyperlipidemia)   Normocytic normochromic anemia   History of stroke   Hip fracture (HCC)   Peri-prosthetic supracondylar fracture of femur   TDWB RLE PT/OT for bed --> chair xfers, ROM R knee DVT ppx: lovenox x30 days, SCDs, TEDs PO pain control D/C planning, SNF placement    [place], Horald Pollen 02/02/2016, 1:53 PM   Rod Can, MD Cell [phone]

## 2016-02-02 NOTE — Progress Notes (Signed)
PROGRESS NOTE    [name]  [id number] DOB: Sep 26, 1923 DOA: 01/31/2016 PCP: Valera Castle, MD    Brief Narrative: [name] is a 82 y.o. female with medical history significant of CVA on Plavix, WPW, HTN who presents for evaluation of R leg pain s/p mechanical fall.  She was found to have Closed fracture of R distal femur -ortho consulted and she underwent Open reduction and internal fixation, right femur on 2/14.    Assessment & Plan:   Principal Problem:   Closed fracture of right distal femur ([place]) Active Problems:   Essential hypertension   Hyponatremia   HLD (hyperlipidemia)   Normocytic normochromic anemia   History of stroke   Hip fracture (HCC)   Peri-prosthetic supracondylar fracture of femur   Open reduction and internal fixation, right femur:  Underwent srugical repair.  Pain control and PT.  PT recommended SNF placement.    Hyponatremia: improved with hydration.    Hypertension controlled.   H/o CVA; off plavix.    Normocytic anemia:  Drop of hemoglobin from baseline of 11 to 8.  Transfuse if hemoglobin is  Less than 7.5.    Fever:  No chills. CXR shows Increased pleural effusion volume on the left. Bibasilar atelectasis or pneumonia. Cardiomegaly without pulmonary edema.UA negative for infection.  Stopped IV fludis.  Monitor.   DVT prophylaxis: lovenox Code Status: (Full/ Family Communication: family at bedside.  Disposition Plan: SNF.    Consultants:   Orthopedics.    Procedures: ORIF on 12/14   Antimicrobials:one does of cefazolin.    Subjective: Pain controlled.   Objective: Vitals:   02/01/16 0445 02/01/16 1400 02/01/16 1955 02/02/16 0445  BP: (!) 147/56 (!) 165/49 (!) 123/43 (!) 146/59  Pulse: 87 95 79 98  Resp: 16 16 16 16   Temp: 99.2 F (37.3 C) 99.3 F (37.4 C) 98.2 F (36.8 C) (!) 101.3 F (38.5 C)  TempSrc: Oral Oral Oral Oral  SpO2: 100% 95% 95% 93%    Intake/Output Summary (Last 24 hours) at  02/02/16 1716 Last data filed at 02/02/16 0830  Gross per 24 hour  Intake              360 ml  Output                2 ml  Net              358 ml   There were no vitals filed for this visit.  Examination:  General exam: Appears calm and comfortable  Respiratory system: Clear to auscultation. Respiratory effort normal. Cardiovascular system: S1 & S2 heard, RRR. No JVD, murmurs, rubs, gallops or clicks. No pedal edema. Gastrointestinal system: Abdomen is nondistended, soft and nontender. No organomegaly or masses felt. Normal bowel sounds heard. Central nervous system: Alert and oriented. No focal neurological deficits. Extremities: tender right lower extremity.  Skin: No rashes, lesions or ulcers     Data Reviewed: I have personally reviewed following labs and imaging studies  CBC:  Recent Labs Lab 01/30/16 2352 01/31/16 0605 02/01/16 0513 02/02/16 0427  WBC 13.6* 10.1 6.7 6.1  HGB 9.8* 8.0* 9.4* 7.9*  HCT 28.3* 23.3* 27.2* 22.7*  MCV 94.2 93.2 88.6 88.0  PLT 227 158 105* [id number]*   Basic Metabolic Panel:  Recent Labs Lab 01/30/16 2352 01/31/16 0605 02/01/16 0513 02/02/16 0427  NA 128* 131* 132* 132*  K 4.5 4.6 4.3 4.3  CL 98* 101 105 105  CO2 25 23 18*  24  GLUCOSE 162* 157* 113* 112*  BUN 29* 28* 24* 19  CREATININE 0.78 0.91 0.77 0.76  CALCIUM 8.5* 8.7* 7.8* 7.7*   GFR: Estimated Creatinine Clearance: 31.3 mL/min (by C-G formula based on SCr of 0.76 mg/dL). Liver Function Tests: No results for input(s): AST, ALT, ALKPHOS, BILITOT, PROT, ALBUMIN in the last 168 hours. No results for input(s): LIPASE, AMYLASE in the last 168 hours. No results for input(s): AMMONIA in the last 168 hours. Coagulation Profile:  Recent Labs Lab 01/30/16 2352  INR 1.13   Cardiac Enzymes: No results for input(s): CKTOTAL, CKMB, CKMBINDEX, TROPONINI in the last 168 hours. BNP (last 3 results) No results for input(s): PROBNP in the last 8760 hours. HbA1C: No results for  input(s): HGBA1C in the last 72 hours. CBG: No results for input(s): GLUCAP in the last 168 hours. Lipid Profile: No results for input(s): CHOL, HDL, LDLCALC, TRIG, CHOLHDL, LDLDIRECT in the last 72 hours. Thyroid Function Tests: No results for input(s): TSH, T4TOTAL, FREET4, T3FREE, THYROIDAB in the last 72 hours. Anemia Panel: No results for input(s): VITAMINB12, FOLATE, FERRITIN, TIBC, IRON, RETICCTPCT in the last 72 hours. Sepsis Labs: No results for input(s): PROCALCITON, LATICACIDVEN in the last 168 hours.  Recent Results (from the past 240 hour(s))  MRSA PCR Screening     Status: None   Collection Time: 01/31/16  7:25 AM  Result Value Ref Range Status   MRSA by PCR NEGATIVE NEGATIVE Final    Comment:        The GeneXpert MRSA Assay (FDA approved for NASAL specimens only), is one component of a comprehensive MRSA colonization surveillance program. It is not intended to diagnose MRSA infection nor to guide or monitor treatment for MRSA infections.          Radiology Studies: Dg Chest 2 View  Result Date: 02/02/2016 CLINICAL DATA:  Post- operative fever following ORIF for a femur fracture EXAM: CHEST  2 VIEW COMPARISON:  Chest x-ray of January 30, 2016 FINDINGS: There has been interval increase in pleural fluid on the left. The left lower lobe is dense. There is hazy increased density at the right lung base which is new. The cardiac silhouette is enlarged. The pulmonary vascularity is not clearly engorged. There is calcification in the wall of the aortic arch. IMPRESSION: Increased pleural effusion volume on the left. Bibasilar atelectasis or pneumonia. Cardiomegaly without pulmonary edema. Thoracic aortic atherosclerosis. Electronically Signed   By: David  Martinique M.D.   On: 02/02/2016 14:45   Dg C-arm 1-60 Min  Result Date: 01/31/2016 CLINICAL DATA:  ORIF distal right femur EXAM: DG C-ARM 61-120 MIN; RIGHT FEMUR 2 VIEWS COMPARISON:  Radiographs 01/30/2016 FINDINGS:  Five low resolution intraoperative spot films of the right femur were obtained. Total fluoroscopy time was 2 minutes 36 seconds. A prior right knee replacement is noted. The images demonstrate surgical plate multiple screw fixation of the distal right femoral fracture with anatomic alignment. Cerclage wires also noted at the distal femur. IMPRESSION: Intraoperative fluoroscopic assistance provided during ORIF of distal right femoral fracture. Electronically Signed   By: Donavan Foil M.D.   On: 01/31/2016 19:48   Dg Femur, Min 2 Views Right  Result Date: 01/31/2016 CLINICAL DATA:  ORIF distal right femur EXAM: DG C-ARM 61-120 MIN; RIGHT FEMUR 2 VIEWS COMPARISON:  Radiographs 01/30/2016 FINDINGS: Five low resolution intraoperative spot films of the right femur were obtained. Total fluoroscopy time was 2 minutes 36 seconds. A prior right knee replacement is noted. The images  demonstrate surgical plate multiple screw fixation of the distal right femoral fracture with anatomic alignment. Cerclage wires also noted at the distal femur. IMPRESSION: Intraoperative fluoroscopic assistance provided during ORIF of distal right femoral fracture. Electronically Signed   By: Donavan Foil M.D.   On: 01/31/2016 19:48   Dg Femur Port, Min 2 Views Right  Result Date: 01/31/2016 CLINICAL DATA:  82 year old female with a history of fracture EXAM: RIGHT FEMUR PORTABLE 2 VIEW COMPARISON:  01/30/2016 FINDINGS: Postoperative changes of open reduction internal fixation of previous right femur fracture, with new lateral plate and screw buttress fixation with cerclage wire. Anatomic alignment has been restored post fixation. Small amount of gas within the surgical bed. Right hip appears located. Re- demonstration of prior right knee arthroplasty. IMPRESSION: Early postoperative changes of ORIF of right distal femur fracture with plate and screw buttress fixation with cerclage wires, and restoration of anatomic alignment. Re-  demonstration of prior surgical changes of right knee arthroplasty. Signed, Dulcy Fanny. Earleen Newport, DO [facility] Radiology Electronically Signed   By: Corrie Mckusick D.O.   On: 01/31/2016 20:54        Scheduled Meds: . atorvastatin  40 mg Oral q1800  . docusate sodium  100 mg Oral BID  . enoxaparin (LOVENOX) injection  40 mg Subcutaneous Q24H  . feeding supplement (ENSURE ENLIVE)  237 mL Oral Q1500  . metoprolol  50 mg Oral Daily  . multivitamin with minerals  1 tablet Oral Daily  . senna  1 tablet Oral BID   Continuous Infusions: . sodium chloride 75 mL/hr at 02/01/16 0843     LOS: 2 days    Time spent: 15 min    Taje Tondreau, MD [facility] Pager [phone]  If 7PM-7AM, please contact night-coverage [email or web address] Password [email or web address] 02/02/2016, 5:16 PM

## 2016-02-02 NOTE — Care Management (Signed)
Patient will return to [facility] SNF at discharge. Social worker is aware. Ricki Miller, RN BSN Case Manager [phone].

## 2016-02-02 NOTE — Progress Notes (Signed)
Physical Therapy Treatment Patient Details Name: [name] MRN: [id number] DOB: 1923/02/21 Today's Date: 02/02/2016    History of Present Illness  [name] is a 82 y.o. female with medical history significant of CVA on Plavix, WPW, HTN who presents for evaluation of R leg pain s/p mechanical fall.  Patient states that she fell in her closet landing on her right side. Pt s/p ORIF (01/31/16) PMH includes L mastectomy, TKR, sciatica, L LE lymphedema    PT Comments    Pt's son in room during session.  Pt was unable to attempt to stand today.  She is weak and guarded leaning hard to the left and posteriorly in sitting EOB.  Maxi sky lift used to get her to the recliner chair for the benefit of OOB mobility.  Pt, per RN staff is due to d/c to SNF for rehab tomorrow.  PT will continue to follow acutely.   Follow Up Recommendations  SNF;Supervision for mobility/OOB     Equipment Recommendations  None recommended by PT    Recommendations for Other Services   NA     Precautions / Restrictions Precautions Precautions: Fall Restrictions RLE Weight Bearing: Touchdown weight bearing    Mobility  Bed Mobility Overal bed mobility: Needs Assistance Bed Mobility: Supine to Sit;Sit to Supine;Rolling Rolling: +2 for physical assistance;Max assist   Supine to sit: +2 for physical assistance;Max assist Sit to supine: +2 for safety/equipment;Max assist   General bed mobility comments: Pt attempting to help with arms, but not helping much with transitions to roll, supine to sit or sit to supine, posterior left lateral lean throughout transitions and very guarded.   Transfers Overall transfer level: Needs assistance               General transfer comment: Maxi sky lift used for OOB to chair time so that pt could have the benefits of changing position and being upright to eat. Pt with such a significant left posterior lean in sitting and even with two person assist could not sit EOB fully  that I did not feel safe attempting even a squat pivot transfer to the chair.          Balance Overall balance assessment: Needs assistance Sitting-balance support: Feet supported;Bilateral upper extremity supported Sitting balance-Leahy Scale: Zero Sitting balance - Comments: Max assist EOB to even attempt midline, but pt pushing off of left hip Postural control: Posterior lean;Left lateral lean     Standing balance comment: unable to stand today                    Cognition Arousal/Alertness: Awake/alert Behavior During Therapy: WFL for tasks assessed/performed;Anxious Overall Cognitive Status: Within Functional Limits for tasks assessed                      Exercises Total Joint Exercises Ankle Circles/Pumps: AAROM;Both;20 reps Quad Sets: AROM;Right;10 reps Heel Slides: AAROM;Right;10 reps Hip ABduction/ADduction: AAROM;Right;10 reps        Pertinent Vitals/Pain Pain Assessment: Faces Faces Pain Scale: Hurts even more Pain Location: right hip Pain Descriptors / Indicators: Grimacing;Guarding Pain Intervention(s): Limited activity within patient's tolerance;Monitored during session;Repositioned           PT Goals (current goals can now be found in the care plan section) Acute Rehab PT Goals Patient Stated Goal: to go to rehab Progress towards PT goals: Progressing toward goals    Frequency    Min 3X/week  PT Plan Current plan remains appropriate       End of Session   Activity Tolerance: Patient limited by fatigue;Patient limited by pain Patient left: in chair;with call bell/phone within reach;with family/visitor present     Time: [id number] PT Time Calculation (min) (ACUTE ONLY): 57 min  Charges:  $Therapeutic Exercise: 8-22 mins $Therapeutic Activity: 38-52 mins                      Mandeep Kiser B. Kyliana Standen, PT, DPT [phone]   02/02/2016, 5:25 PM

## 2016-02-03 LAB — CBC
HEMATOCRIT: 20.8 % — AB (ref 36.0–46.0)
HEMOGLOBIN: 7.3 g/dL — AB (ref 12.0–15.0)
MCH: 31.1 pg (ref 26.0–34.0)
MCHC: 35.1 g/dL (ref 30.0–36.0)
MCV: 88.5 fL (ref 78.0–100.0)
Platelets: 104 10*3/uL — ABNORMAL LOW (ref 150–400)
RBC: 2.35 MIL/uL — ABNORMAL LOW (ref 3.87–5.11)
RDW: 15.9 % — ABNORMAL HIGH (ref 11.5–15.5)
WBC: 6.1 10*3/uL (ref 4.0–10.5)

## 2016-02-03 LAB — PREPARE RBC (CROSSMATCH)

## 2016-02-03 MED ORDER — SODIUM CHLORIDE 0.9 % IV SOLN
Freq: Once | INTRAVENOUS | Status: AC
Start: 2016-02-03 — End: 2016-02-03
  Administered 2016-02-03: 10:00:00 via INTRAVENOUS

## 2016-02-03 NOTE — Progress Notes (Signed)
PROGRESS NOTE    [name]  [id number] DOB: 1923/08/19 DOA: 01/31/2016 PCP: Valera Castle, MD    Brief Narrative: [name] is a 82 y.o. female with medical history significant of CVA on Plavix, WPW, HTN who presents for evaluation of R leg pain s/p mechanical fall.  She was found to have Closed fracture of R distal femur -ortho consulted and she underwent Open reduction and internal fixation, right femur on 2/14.    Assessment & Plan:   Principal Problem:   Closed fracture of right distal femur ([place]) Active Problems:   Essential hypertension   Hyponatremia   HLD (hyperlipidemia)   Normocytic normochromic anemia   History of stroke   Hip fracture (HCC)   Peri-prosthetic supracondylar fracture of femur   Open reduction and internal fixation, right femur:  Underwent srugical repair.  Pain control and PT.  PT recommended SNF placement.  lovenox for 30 days and TDWB on the RLE.    Hyponatremia: improved with hydration.    Hypertension controlled.  Resume metoprolol.   H/o CVA; off plavix.    Normocytic anemia:  Drop of hemoglobin from baseline of 11 to 7.2 , suspect anemia of blood loss from surgery, 1 unit of prbc transfusion ordered. Repeat hemoglobin in am.     Fever post op day 1. :  No chills. CXR shows Increased pleural effusion volume on the left. Bibasilar atelectasis or pneumonia. Cardiomegaly without pulmonary edema.UA negative for infection.  Stopped IV fludis. Incentive spirometry. Monitor.  Afebrile today.    DVT prophylaxis: lovenox Code Status: (Full/ Family Communication: family at bedside. Answered all their questions.  Disposition Plan: SNF.    Consultants:   Orthopedics.    Procedures: ORIF on 12/14   Antimicrobials:one does of cefazolin.    Subjective: Pain controlled.   Objective: Vitals:   02/03/16 1300 02/03/16 1310 02/03/16 1317 02/03/16 1515  BP: (!) 147/58 (!) 163/95 (!) 140/41 (!) 152/43  Pulse: 78 86   69  Resp: 16 18  15   Temp: 98.6 F (37 C) 98.3 F (36.8 C) 98 F (36.7 C) 98.2 F (36.8 C)  TempSrc:  Oral Oral Oral  SpO2:  97%  99%    Intake/Output Summary (Last 24 hours) at 02/03/16 1804 Last data filed at 02/03/16 1700  Gross per 24 hour  Intake              480 ml  Output              700 ml  Net             -220 ml   There were no vitals filed for this visit.  Examination:  General exam: Appears calm and comfortable  Respiratory system: Clear to auscultation. Respiratory effort normal. Cardiovascular system: S1 & S2 heard, RRR. No JVD, murmurs, rubs, gallops or clicks. No pedal edema. Gastrointestinal system: Abdomen is nondistended, soft and nontender. No organomegaly or masses felt. Normal bowel sounds heard. Central nervous system: Alert and oriented. No focal neurological deficits. Extremities: tender right lower extremity.  Skin: No rashes, lesions or ulcers     Data Reviewed: I have personally reviewed following labs and imaging studies  CBC:  Recent Labs Lab 01/30/16 2352 01/31/16 0605 02/01/16 0513 02/02/16 0427 02/03/16 0414  WBC 13.6* 10.1 6.7 6.1 6.1  HGB 9.8* 8.0* 9.4* 7.9* 7.3*  HCT 28.3* 23.3* 27.2* 22.7* 20.8*  MCV 94.2 93.2 88.6 88.0 88.5  PLT 227 158 105* 101* 104*  Basic Metabolic Panel:  Recent Labs Lab 01/30/16 2352 01/31/16 0605 02/01/16 0513 02/02/16 0427  NA 128* 131* 132* 132*  K 4.5 4.6 4.3 4.3  CL 98* 101 105 105  CO2 25 23 18* 24  GLUCOSE 162* 157* 113* 112*  BUN 29* 28* 24* 19  CREATININE 0.78 0.91 0.77 0.76  CALCIUM 8.5* 8.7* 7.8* 7.7*   GFR: Estimated Creatinine Clearance: 31.3 mL/min (by C-G formula based on SCr of 0.76 mg/dL). Liver Function Tests: No results for input(s): AST, ALT, ALKPHOS, BILITOT, PROT, ALBUMIN in the last 168 hours. No results for input(s): LIPASE, AMYLASE in the last 168 hours. No results for input(s): AMMONIA in the last 168 hours. Coagulation Profile:  Recent Labs Lab  01/30/16 2352  INR 1.13   Cardiac Enzymes: No results for input(s): CKTOTAL, CKMB, CKMBINDEX, TROPONINI in the last 168 hours. BNP (last 3 results) No results for input(s): PROBNP in the last 8760 hours. HbA1C: No results for input(s): HGBA1C in the last 72 hours. CBG: No results for input(s): GLUCAP in the last 168 hours. Lipid Profile: No results for input(s): CHOL, HDL, LDLCALC, TRIG, CHOLHDL, LDLDIRECT in the last 72 hours. Thyroid Function Tests: No results for input(s): TSH, T4TOTAL, FREET4, T3FREE, THYROIDAB in the last 72 hours. Anemia Panel: No results for input(s): VITAMINB12, FOLATE, FERRITIN, TIBC, IRON, RETICCTPCT in the last 72 hours. Sepsis Labs: No results for input(s): PROCALCITON, LATICACIDVEN in the last 168 hours.  Recent Results (from the past 240 hour(s))  MRSA PCR Screening     Status: None   Collection Time: 01/31/16  7:25 AM  Result Value Ref Range Status   MRSA by PCR NEGATIVE NEGATIVE Final    Comment:        The GeneXpert MRSA Assay (FDA approved for NASAL specimens only), is one component of a comprehensive MRSA colonization surveillance program. It is not intended to diagnose MRSA infection nor to guide or monitor treatment for MRSA infections.          Radiology Studies: Dg Chest 2 View  Result Date: 02/02/2016 CLINICAL DATA:  Post- operative fever following ORIF for a femur fracture EXAM: CHEST  2 VIEW COMPARISON:  Chest x-ray of January 30, 2016 FINDINGS: There has been interval increase in pleural fluid on the left. The left lower lobe is dense. There is hazy increased density at the right lung base which is new. The cardiac silhouette is enlarged. The pulmonary vascularity is not clearly engorged. There is calcification in the wall of the aortic arch. IMPRESSION: Increased pleural effusion volume on the left. Bibasilar atelectasis or pneumonia. Cardiomegaly without pulmonary edema. Thoracic aortic atherosclerosis. Electronically  Signed   By: David  Martinique M.D.   On: 02/02/2016 14:45        Scheduled Meds: . atorvastatin  40 mg Oral q1800  . docusate sodium  100 mg Oral BID  . enoxaparin (LOVENOX) injection  40 mg Subcutaneous Q24H  . feeding supplement (ENSURE ENLIVE)  237 mL Oral Q1500  . metoprolol  50 mg Oral Daily  . multivitamin with minerals  1 tablet Oral Daily  . senna  1 tablet Oral BID   Continuous Infusions:    LOS: 3 days    Time spent: 49 min    Zyla Dascenzo, MD [facility] Pager [phone]  If 7PM-7AM, please contact night-coverage [email or web address] Password [email or web address] 02/03/2016, 6:04 PM

## 2016-02-04 ENCOUNTER — Inpatient Hospital Stay (HOSPITAL_COMMUNITY): Payer: Medicare Other

## 2016-02-04 LAB — CBC
HEMATOCRIT: 27.1 % — AB (ref 36.0–46.0)
HEMOGLOBIN: 9.3 g/dL — AB (ref 12.0–15.0)
MCH: 30.1 pg (ref 26.0–34.0)
MCHC: 34.3 g/dL (ref 30.0–36.0)
MCV: 87.7 fL (ref 78.0–100.0)
Platelets: 144 10*3/uL — ABNORMAL LOW (ref 150–400)
RBC: 3.09 MIL/uL — ABNORMAL LOW (ref 3.87–5.11)
RDW: 16.6 % — AB (ref 11.5–15.5)
WBC: 5.2 10*3/uL (ref 4.0–10.5)

## 2016-02-04 LAB — TYPE AND SCREEN
BLOOD PRODUCT EXPIRATION DATE: 201712262359
BLOOD PRODUCT EXPIRATION DATE: 201712272359
Blood Product Expiration Date: 201712272359
ISSUE DATE / TIME: 201712131757
ISSUE DATE / TIME: 201712131757
ISSUE DATE / TIME: 201712161250
UNIT TYPE AND RH: 5100
UNIT TYPE AND RH: 5100
Unit Type and Rh: 5100

## 2016-02-04 MED ORDER — LEVOFLOXACIN 500 MG PO TABS
500.0000 mg | ORAL_TABLET | ORAL | Status: DC
  Administered 2016-02-04: 500 mg via ORAL
  Filled 2016-02-04: qty 1

## 2016-02-04 MED ORDER — WHITE PETROLATUM GEL
Status: AC
  Administered 2016-02-04: 12:00:00
  Filled 2016-02-04: qty 1

## 2016-02-04 NOTE — Clinical Social Work Note (Signed)
Clinical Social Worker continuing to follow patient and family for support and discharge planning needs.  Per MD, plan is for discharge tomorrow back to the [facility].  CSW updated facility and will facilitate patient discharge needs once medically stable.  Barbette Or, [place]

## 2016-02-04 NOTE — Progress Notes (Signed)
Patient ID: [name], female   DOB: 1923-06-07, 82 y.o.   MRN: [id number] Subjective: 4 Days Post-Op Procedure(s) (LRB): OPEN REDUCTION INTERNAL FIXATION DISTAL FEMUR FRACTURE (Right)    Patient reports pain as mild but not much activity at this point  Objective:   VITALS:   Vitals:   02/04/16 0546 02/04/16 0547  BP: (!) 158/59 (!) 158/50  Pulse: 72   Resp:    Temp:  99 F (37.2 C)    Neurovascular intact Incision: dressing changed today to Mepilex RLE edema  LABS  Recent Labs  02/02/16 0427 02/03/16 0414  HGB 7.9* 7.3*  HCT 22.7* 20.8*  WBC 6.1 6.1  PLT 101* 104*     Recent Labs  02/02/16 0427  NA 132*  K 4.3  BUN 19  CREATININE 0.76  GLUCOSE 112*    No results for input(s): LABPT, INR in the last 72 hours.   Assessment/Plan: 4 Days Post-Op Procedure(s) (LRB): OPEN REDUCTION INTERNAL FIXATION DISTAL FEMUR FRACTURE (Right)   Up with therapy Discharge to SNF when set up WB per [facility]

## 2016-02-04 NOTE — Progress Notes (Signed)
Physical Therapy Treatment Patient Details Name: [name] MRN: [id number] DOB: 1923-08-09 Today's Date: 02/04/2016    History of Present Illness  [name] is a 82 y.o. female with medical history significant of CVA on Plavix, WPW, HTN who presents for evaluation of R leg pain s/p mechanical fall.  Patient states that she fell in her closet landing on her right side. Pt s/p ORIF (01/31/16) PMH includes L mastectomy, TKR, sciatica, L LE lymphedema    PT Comments    Patient is making gradual progress toward mobility goals with much improved sitting balance EOB. +2 assist needed for bed mobility (transitional movements) and OOB transfer however did not require lift this session. Son present throughout session. Continue to progress as tolerated with anticipated d/c to SNF for further skilled PT services.    Follow Up Recommendations  SNF;Supervision for mobility/OOB     Equipment Recommendations  None recommended by PT    Recommendations for Other Services       Precautions / Restrictions Precautions Precautions: Fall Restrictions Weight Bearing Restrictions: Yes RLE Weight Bearing: Touchdown weight bearing    Mobility  Bed Mobility Overal bed mobility: Needs Assistance Bed Mobility: Supine to Sit     Supine to sit: +2 for physical assistance;Max assist     General bed mobility comments: assist to bring bilat LE to EOB and elevate trunk into sitting; use of bed pad; pt able to assist more this session especially with L side  Transfers Overall transfer level: Needs assistance Equipment used: 2 person hand held assist (side by side with gait belt) Transfers: Squat Pivot Transfers     Squat pivot transfers: Max assist;+2 physical assistance     General transfer comment: cues for hand placement and technique; therapist assisted with maintaining TDWB on R LE  Ambulation/Gait                 Stairs            Wheelchair Mobility    Modified Rankin  (Stroke Patients Only)       Balance Overall balance assessment: Needs assistance Sitting-balance support: Feet supported;Bilateral upper extremity supported Sitting balance-Leahy Scale: Fair Sitting balance - Comments: pt maintained sitting balance EOB with min A initally and then progressed to min guard with multimodal cues for positioning and hand placement; pt with improved balance with bilat hands in lap Postural control: Left lateral lean     Standing balance comment: unable to stand today                    Cognition Arousal/Alertness: Awake/alert Behavior During Therapy: WFL for tasks assessed/performed;Anxious Overall Cognitive Status: Within Functional Limits for tasks assessed                      Exercises Total Joint Exercises Ankle Circles/Pumps: Both;AROM;10 reps Heel Slides: AAROM;Right;5 reps;Limitations (pt resisted hip/knee flexion)    General Comments        Pertinent Vitals/Pain Pain Assessment: Faces Faces Pain Scale: Hurts little more Pain Location: R LE Pain Descriptors / Indicators: Grimacing;Guarding Pain Intervention(s): Limited activity within patient's tolerance;Monitored during session;Premedicated before session;Repositioned    Home Living                      Prior Function            PT Goals (current goals can now be found in the care plan section) Acute Rehab PT Goals  Patient Stated Goal: to go to rehab Progress towards PT goals: Progressing toward goals    Frequency    Min 3X/week      PT Plan Current plan remains appropriate    Co-evaluation             End of Session Equipment Utilized During Treatment: Gait belt Activity Tolerance: Patient limited by fatigue;Patient limited by pain Patient left: in chair;with call bell/phone within reach;with family/visitor present     Time: 1510-1541 PT Time Calculation (min) (ACUTE ONLY): 31 min  Charges:  $Therapeutic Activity: 23-37 mins                     G Codes:      Salina April, PTA Pager: [phone]   02/04/2016, 3:58 PM

## 2016-02-04 NOTE — Progress Notes (Signed)
Pharmacy Antibiotic Note  [name] is a 82 y.o. female admitted on 01/31/2016 with pneumonia.  Pharmacy has been consulted for levofloxacin dosing. Patient with new fever postop and chest xray with new hazy density. Current renal function is estimated to be ~30 ml/min.  Plan: - Levofloxacin 500 mg PO q48h - Monitor renal function, vital signs and length of treatment  Temp (24hrs), Avg:98.7 F (37.1 C), Min:98.1 F (36.7 C), Max:99 F (37.2 C)   Recent Labs Lab 01/30/16 2352 01/31/16 0605 02/01/16 0513 02/02/16 0427 02/03/16 0414 02/04/16 0906  WBC 13.6* 10.1 6.7 6.1 6.1 5.2  CREATININE 0.78 0.91 0.77 0.76  --   --     Estimated Creatinine Clearance: 31.3 mL/min (by C-G formula based on SCr of 0.76 mg/dL).    No Known Allergies  Antimicrobials this admission: Cefazolin 12/14 x2 doses preop Levofloxacin 12/17 >>  Dose adjustments this admission:  Microbiology results: 12/13 MRSA PCR: negative  Thank you for allowing pharmacy to be a part of this patient's care.  Dimitri Ped, PharmD, BCPS PGY-2 Infectious Diseases Pharmacy Resident Pager: [phone] 02/04/2016 5:28 PM

## 2016-02-04 NOTE — Progress Notes (Signed)
PROGRESS NOTE    [name]  [id number] DOB: [birth year] DOA: 01/31/2016 PCP: Valera Castle, MD   No chief complaint on file.    Brief Narrative:  [name] Sykesis a 82 y.o.femalewith medical history significant of CVA on Plavix, WPW, HTN who presents for evaluation of R leg pain s/p mechanical fall.  She was found to have Closed fracture of R distal femur -ortho consulted and she underwent Open reduction and internal fixation, right femur on 12/14.  Assessment & Plan   Closed fracture of the right distal femur -Orthopedics consulted and appreciated -Underwent ORIF of the right femur -PT and OT recommended SNF -Orthopedics recommended Lovenox for 30 days and TDWB on the right lower extremity  Hyponatremia -Improving with hydration  Hypertension  -Continue metoprolol  History of CVA -Plavix held, would resume in one week  Normocytic anemia -Baseline hemoglobin 11, hemoglobin dropped to 7.2 -Transfused 1 unit PRBC -Hemoglobin currently 9.3  Hyperlipidemia -Continue statin  Postop fever -Patient did have fever postop day 1 -Currently afebrile -Chest x-ray showed increased pleural effusion volume on the left. Hazy density noted in the right lung base. -UA negative for infection -Patient does complain of pain feeling short of breath and having cough. Will repeat x-ray today.  DVT Prophylaxis    Code Status: Full  Family Communication: None at bedside  Disposition Plan: Admitted. Plan to DC to SNF, possibly on 02/05/2016  Procedures: ORIF of right femur  Consultations: Orthopedic surgery  Antibiotics   Anti-infectives    Start     Dose/Rate Route Frequency Ordered Stop   01/31/16 2300  ceFAZolin (ANCEF) IVPB 2g/100 mL premix     2 g 200 mL/hr over 30 Minutes Intravenous Every 6 hours 01/31/16 2134 02/01/16 0518   01/31/16 1704  ceFAZolin (ANCEF) 2-4 GM/100ML-% IVPB    Comments:  [name]   : cabinet override      01/31/16 1704  02/01/16 0514   01/31/16 0900  ceFAZolin (ANCEF) IVPB 2g/100 mL premix  Status:  Discontinued     2 g 200 mL/hr over 30 Minutes Intravenous On call to O.R. 01/31/16 0846 01/31/16 2107      Subjective:   [name] seen and examined today.   Currently complains of shortness of breath and feels she cannot cough anything up.  Does complain of some right leg pain, particularly with movement. Denies Chest pain, abdominal pain, nausea or vomiting, diarrhea constipation, dizziness or headache.  Objective:   Vitals:   02/03/16 1515 02/03/16 2136 02/04/16 0546 02/04/16 0547  BP: (!) 152/43 (!) 146/46 (!) 158/59 (!) 158/50  Pulse: 69 77 72   Resp: 15     Temp: 98.2 F (36.8 C) 98.9 F (37.2 C)  99 F (37.2 C)  TempSrc: Oral Oral  Oral  SpO2: 99% 95% 95%     Intake/Output Summary (Last 24 hours) at 02/04/16 1439 Last data filed at 02/04/16 0800  Gross per 24 hour  Intake              480 ml  Output              453 ml  Net               27 ml   There were no vitals filed for this visit.  Exam  General: Well developed, well nourished, NAD, appears stated age  HEENT: NCAT,  mucous membranes moist.  Cardiovascular: S1 S2 auscultated, RRR  Respiratory: Clear to auscultation  bilaterally with equal chest rise  Abdomen: Soft, nontender, nondistended, + bowel sounds  Extremities: warm dry without cyanosis clubbing or edema  Neuro: AAOx3, nonfocal  Skin: Without rashes exudates or nodules  Psych: Normal affect and demeanor    Data Reviewed: I have personally reviewed following labs and imaging studies  CBC:  Recent Labs Lab 01/31/16 0605 02/01/16 0513 02/02/16 0427 02/03/16 0414 02/04/16 0906  WBC 10.1 6.7 6.1 6.1 5.2  HGB 8.0* 9.4* 7.9* 7.3* 9.3*  HCT 23.3* 27.2* 22.7* 20.8* 27.1*  MCV 93.2 88.6 88.0 88.5 87.7  PLT 158 105* 101* 104* [id number]*   Basic Metabolic Panel:  Recent Labs Lab 01/30/16 2352 01/31/16 0605 02/01/16 0513 02/02/16 0427  NA 128* 131* 132*  132*  K 4.5 4.6 4.3 4.3  CL 98* 101 105 105  CO2 25 23 18* 24  GLUCOSE 162* 157* 113* 112*  BUN 29* 28* 24* 19  CREATININE 0.78 0.91 0.77 0.76  CALCIUM 8.5* 8.7* 7.8* 7.7*   GFR: Estimated Creatinine Clearance: 31.3 mL/min (by C-G formula based on SCr of 0.76 mg/dL). Liver Function Tests: No results for input(s): AST, ALT, ALKPHOS, BILITOT, PROT, ALBUMIN in the last 168 hours. No results for input(s): LIPASE, AMYLASE in the last 168 hours. No results for input(s): AMMONIA in the last 168 hours. Coagulation Profile:  Recent Labs Lab 01/30/16 2352  INR 1.13   Cardiac Enzymes: No results for input(s): CKTOTAL, CKMB, CKMBINDEX, TROPONINI in the last 168 hours. BNP (last 3 results) No results for input(s): PROBNP in the last 8760 hours. HbA1C: No results for input(s): HGBA1C in the last 72 hours. CBG: No results for input(s): GLUCAP in the last 168 hours. Lipid Profile: No results for input(s): CHOL, HDL, LDLCALC, TRIG, CHOLHDL, LDLDIRECT in the last 72 hours. Thyroid Function Tests: No results for input(s): TSH, T4TOTAL, FREET4, T3FREE, THYROIDAB in the last 72 hours. Anemia Panel: No results for input(s): VITAMINB12, FOLATE, FERRITIN, TIBC, IRON, RETICCTPCT in the last 72 hours. Urine analysis:    Component Value Date/Time   COLORURINE YELLOW (A) 01/31/2016 0059   APPEARANCEUR CLEAR (A) 01/31/2016 0059   APPEARANCEUR Clear 07/11/2011 0011   LABSPEC 1.021 01/31/2016 0059   LABSPEC 1.006 07/11/2011 0011   PHURINE 6.0 01/31/2016 0059   GLUCOSEU NEGATIVE 01/31/2016 0059   GLUCOSEU Negative 07/11/2011 0011   HGBUR NEGATIVE 01/31/2016 0059   BILIRUBINUR NEGATIVE 01/31/2016 0059   BILIRUBINUR Negative 07/11/2011 0011   KETONESUR 5 (A) 01/31/2016 0059   PROTEINUR 30 (A) 01/31/2016 0059   NITRITE NEGATIVE 01/31/2016 0059   LEUKOCYTESUR NEGATIVE 01/31/2016 0059   LEUKOCYTESUR 3+ 07/11/2011 0011   Sepsis Labs: @LABRCNTIP (procalcitonin:4,lacticidven:4)  ) Recent Results  (from the past 240 hour(s))  MRSA PCR Screening     Status: None   Collection Time: 01/31/16  7:25 AM  Result Value Ref Range Status   MRSA by PCR NEGATIVE NEGATIVE Final    Comment:        The GeneXpert MRSA Assay (FDA approved for NASAL specimens only), is one component of a comprehensive MRSA colonization surveillance program. It is not intended to diagnose MRSA infection nor to guide or monitor treatment for MRSA infections.       Radiology Studies: Dg Chest 2 View  Result Date: 02/02/2016 CLINICAL DATA:  Post- operative fever following ORIF for a femur fracture EXAM: CHEST  2 VIEW COMPARISON:  Chest x-ray of January 30, 2016 FINDINGS: There has been interval increase in pleural fluid on the left. The left lower  lobe is dense. There is hazy increased density at the right lung base which is new. The cardiac silhouette is enlarged. The pulmonary vascularity is not clearly engorged. There is calcification in the wall of the aortic arch. IMPRESSION: Increased pleural effusion volume on the left. Bibasilar atelectasis or pneumonia. Cardiomegaly without pulmonary edema. Thoracic aortic atherosclerosis. Electronically Signed   By: David  Martinique M.D.   On: 02/02/2016 14:45     Scheduled Meds: . atorvastatin  40 mg Oral q1800  . docusate sodium  100 mg Oral BID  . enoxaparin (LOVENOX) injection  40 mg Subcutaneous Q24H  . feeding supplement (ENSURE ENLIVE)  237 mL Oral Q1500  . metoprolol  50 mg Oral Daily  . multivitamin with minerals  1 tablet Oral Daily  . senna  1 tablet Oral BID   Continuous Infusions:   LOS: 4 days   Time Spent in minutes   30 minutes  [name] D.O. on 02/04/2016 at 2:39 PM  Between 7am to 7pm - Pager - [phone]  After 7pm go to [email or web address] - password [email or web address]  And look for the night coverage person covering for me after hours  [facility] Office  [phone]

## 2016-02-05 DIAGNOSIS — S72401D Unspecified fracture of lower end of right femur, subsequent encounter for closed fracture with routine healing: Secondary | ICD-10-CM

## 2016-02-05 LAB — CBC
HCT: 27.5 % — ABNORMAL LOW (ref 36.0–46.0)
Hemoglobin: 9.5 g/dL — ABNORMAL LOW (ref 12.0–15.0)
MCH: 30.1 pg (ref 26.0–34.0)
MCHC: 34.5 g/dL (ref 30.0–36.0)
MCV: 87 fL (ref 78.0–100.0)
PLATELETS: 177 10*3/uL (ref 150–400)
RBC: 3.16 MIL/uL — ABNORMAL LOW (ref 3.87–5.11)
RDW: 15.7 % — AB (ref 11.5–15.5)
WBC: 4.6 10*3/uL (ref 4.0–10.5)

## 2016-02-05 LAB — BASIC METABOLIC PANEL
Anion gap: 7 (ref 5–15)
BUN: 14 mg/dL (ref 6–20)
CALCIUM: 7.9 mg/dL — AB (ref 8.9–10.3)
CO2: 23 mmol/L (ref 22–32)
CREATININE: 0.58 mg/dL (ref 0.44–1.00)
Chloride: 105 mmol/L (ref 101–111)
Glucose, Bld: 99 mg/dL (ref 65–99)
Potassium: 3.5 mmol/L (ref 3.5–5.1)
SODIUM: 135 mmol/L (ref 135–145)

## 2016-02-05 MED ORDER — SENNA 8.6 MG PO TABS: 1.0000 | ORAL_TABLET | Freq: Two times a day (BID) | ORAL | 0 refills | Status: AC

## 2016-02-05 MED ORDER — LEVOFLOXACIN 500 MG PO TABS: 500.0000 mg | ORAL_TABLET | ORAL | 0 refills | Status: DC

## 2016-02-05 MED ORDER — ENOXAPARIN SODIUM 40 MG/0.4ML ~~LOC~~ SOLN: 40.0000 mg | SUBCUTANEOUS | 0 refills | Status: DC

## 2016-02-05 MED ORDER — HYDROCODONE-ACETAMINOPHEN 5-325 MG PO TABS: 1.0000 | ORAL_TABLET | Freq: Four times a day (QID) | ORAL | 0 refills | Status: AC | PRN

## 2016-02-05 MED ORDER — POLYETHYLENE GLYCOL 3350 17 G PO PACK: 17.0000 g | PACK | Freq: Every day | ORAL | 0 refills | Status: AC | PRN

## 2016-02-05 MED ORDER — FUROSEMIDE 10 MG/ML IJ SOLN
40.0000 mg | Freq: Once | INTRAMUSCULAR | Status: AC
  Administered 2016-02-05: 40 mg via INTRAVENOUS
  Filled 2016-02-05: qty 4

## 2016-02-05 NOTE — Progress Notes (Signed)
[facility] gave report to Anette Riedel, [facility] has been called, Son notified

## 2016-02-05 NOTE — Discharge Summary (Signed)
Physician Discharge Summary  [name] [id number] DOB: Jun 14, 1923 DOA: 01/31/2016  PCP: Valera Castle, MD  Admit date: 01/31/2016 Discharge date: 02/05/2016  Admitted From: SNF Disposition:  [facility] in [place]  Recommendations for Outpatient Follow-up:  1. Follow up with PCP in 1-2 weeks 2. Please obtain BMP/CBC in one week 3. The patient will need Lovenox for about 4 weeks patient is touchdown weight wearing on the operated leg right leg 4. Patient will need levofloxacin for 3 more doses every other day for presumed pneumonia 5. Re-eval fluid status to see if needs further diuretic   Home Health: No Equipment/Devices: None at present  Discharge Condition: Stable on discharge CODE STATUS: DNR Diet recommendation: Heart Healthy / Carb Modified   Brief/Interim Summary:  82 y.o.femalewith medical history significant of CVA on Plavix, WPW, HTN who presents for evaluation of R leg pain s/p mechanical fall. She was found to have Closed fracture of R distal femur -ortho consulted and she underwent Open reduction and internal fixation, right femuron 12/14.   Closed fracture of the right distal femur -Orthopedics consulted and appreciated -Underwent ORIF of the right femur -PT and OT recommended SNF -Orthopedics recommended Lovenox for 30 days and TDWB on the right lower extremity  Hyponatremia -Improving with hydration  Hypertension  -Continue metoprolol  History of CVA -Plavix held, resume post-op  Normocytic anemia -Baseline hemoglobin 11, hemoglobin dropped to 7.2 -Transfused 1 unit PRBC -Hemoglobin currently 9.5 on day of discharge  Hyperlipidemia -Continue statin  Postop fever -Patient did have fever postop day 1 -Currently afebrile -Chest x-ray showed increased pleural effusion volume on the left. Hazy density noted in the right lung base. -UA negative for infection -Patient does complain of pain feeling short of breath and having  cough.  -Patient will complete 3 more doses every other day of by mouth Levaquin on discharge -Because of haziness on chest x-ray without any ongoing signs of infection as well as slightly elevated JVD I gave a dose of IV Lasix prior to discharge   Discharge Diagnoses:  Principal Problem:   Closed fracture of right distal femur ([place]) Active Problems:   Essential hypertension   Hyponatremia   HLD (hyperlipidemia)   Normocytic normochromic anemia   History of stroke   Hip fracture (HCC)   Peri-prosthetic supracondylar fracture of femur    Discharge Instructions  Discharge Instructions    Diet - low sodium heart healthy    Complete by:  As directed    Increase activity slowly    Complete by:  As directed      Allergies as of 02/05/2016   No Known Allergies     Medication List    TAKE these medications   acetaminophen 500 MG tablet Commonly known as:  TYLENOL Take 1,000 mg by mouth 2 (two) times daily as needed for moderate pain.   amLODipine 5 MG tablet Commonly known as:  NORVASC Take 5 mg by mouth daily.   atorvastatin 40 MG tablet Commonly known as:  LIPITOR Take 1 tablet (40 mg total) by mouth daily at 6 PM.   benazepril 40 MG tablet Commonly known as:  LOTENSIN Take 40 mg by mouth daily.   clopidogrel 75 MG tablet Commonly known as:  PLAVIX Take 1 tablet (75 mg total) by mouth daily.   enoxaparin 40 MG/0.4ML injection Commonly known as:  LOVENOX Inject 0.4 mLs (40 mg total) into the skin daily. Start taking on:  02/06/2016   HYDROcodone-acetaminophen 5-325 MG tablet Commonly  known as:  NORCO/VICODIN Take 1-2 tablets by mouth every 6 (six) hours as needed for moderate pain.   ibuprofen 200 MG tablet Commonly known as:  ADVIL,MOTRIN Take 400 mg by mouth every 6 (six) hours as needed for moderate pain.   levofloxacin 500 MG tablet Commonly known as:  LEVAQUIN Take 1 tablet (500 mg total) by mouth every other day. Start taking on:  02/06/2016    metoprolol succinate 50 MG 24 hr tablet Commonly known as:  TOPROL-XL Take 50 mg by mouth daily. Take with or immediately following a meal.   multivitamin with minerals Tabs tablet Take 1 tablet by mouth daily.   polyethylene glycol packet Commonly known as:  MIRALAX / GLYCOLAX Take 17 g by mouth daily as needed for mild constipation.   senna 8.6 MG Tabs tablet Commonly known as:  SENOKOT Take 1 tablet (8.6 mg total) by mouth 2 (two) times daily.   tolterodine 1 MG tablet Commonly known as:  DETROL Take 1 mg by mouth 2 (two) times daily.      Follow-up Information    Swinteck, Horald Pollen, MD. Schedule an appointment as soon as possible for a visit in 2 weeks.   Specialty:  Orthopedic Surgery Why:  For wound re-check Contact information: [address]. [address] [place] [address] [phone]          No Known Allergies  Consultations: Orthopedics will need follow-up as per discharge reconciliation with them in the next 2-3 week  Procedures/Studies: Dg Chest 2 View  Result Date: 02/04/2016 CLINICAL DATA:  Shortness of breath, cough EXAM: CHEST  2 VIEW COMPARISON:  02/02/2016 FINDINGS: Moderate to large left pleural effusion. Small right pleural effusion. Bilateral lower lobe atelectasis or infiltrates, left greater than right. Findings similar to prior study. Heart is mildly enlarged. IMPRESSION: Moderate to large left effusion with small right effusion. Bilateral lower lobe atelectasis or infiltrates, left greater than right. No change. Electronically Signed   By: Rolm Baptise M.D.   On: 02/04/2016 15:26   Dg Chest 2 View  Result Date: 02/02/2016 CLINICAL DATA:  Post- operative fever following ORIF for a femur fracture EXAM: CHEST  2 VIEW COMPARISON:  Chest x-ray of January 30, 2016 FINDINGS: There has been interval increase in pleural fluid on the left. The left lower lobe is dense. There is hazy increased density at the right lung base which is new. The  cardiac silhouette is enlarged. The pulmonary vascularity is not clearly engorged. There is calcification in the wall of the aortic arch. IMPRESSION: Increased pleural effusion volume on the left. Bibasilar atelectasis or pneumonia. Cardiomegaly without pulmonary edema. Thoracic aortic atherosclerosis. Electronically Signed   By: David  Martinique M.D.   On: 02/02/2016 14:45   Dg Pelvis 1-2 Views  Result Date: 01/30/2016 CLINICAL DATA:  Fall today at nursing home. RIGHT distal femur bruising. EXAM: PELVIS - 1-2 VIEW COMPARISON:  Pelvic radiograph January 20, 2012 FINDINGS: There is no evidence of pelvic fracture or diastasis. No pelvic bone lesions are seen. Osteopenia. Severe degenerative change of the included cervical spine. IMPRESSION: Negative. Electronically Signed   By: Elon Alas M.D.   On: 01/30/2016 22:43   Ct Head Wo Contrast  Result Date: 01/30/2016 CLINICAL DATA:  Fall today at nursing home. Distal femur bruising and RIGHT leg pain. History of hypertension. EXAM: CT HEAD WITHOUT CONTRAST TECHNIQUE: Contiguous axial images were obtained from the base of the skull through the vertex without intravenous contrast. COMPARISON:  MRI head November 07, 2015. FINDINGS: BRAIN: The ventricles and sulci are normal for age. No intraparenchymal hemorrhage, mass effect nor midline shift. Patchy supratentorial white matter hypodensities less than expected for patient's age, though non-specific are most compatible with chronic small vessel ischemic disease. No acute large vascular territory infarcts. Old LEFT thalamus lacunar infarcts. No abnormal extra-axial fluid collections. Basal cisterns are patent. VASCULAR: Mild calcific atherosclerosis of the carotid siphons. SKULL: No skull fracture. No significant scalp soft tissue swelling. SINUSES/ORBITS: The mastoid air-cells and included paranasal sinuses are well-aerated.The included ocular globes and orbital contents are non-suspicious. OTHER: None.  IMPRESSION: No acute intracranial process. Stable examination including old LEFT thalamus lacunar infarct and mild to moderate chronic small vessel ischemic disease. Electronically Signed   By: Elon Alas M.D.   On: 01/30/2016 22:46   Ct Knee Right Wo Contrast  Result Date: 01/31/2016 CLINICAL DATA:  82 year old female with right femoral fracture. Right knee arthroplasty periods EXAM: CT OF THE right KNEE WITHOUT CONTRAST TECHNIQUE: Multidetector CT imaging of the right knee was performed according to the standard protocol. Multiplanar CT image reconstructions were also generated. COMPARISON:  Right femoral radiograph dated 01/30/2016 FINDINGS: Bones/Joint/Cartilage There is a comminuted and displaced oblique fracture of the distal third of the right femur. There is approximately 2 cm anterior displacement of the largest distal fracture fragment. There is lateral displacement and posterior angulation of the distal femur and knee in relation to the femoral shaft. There is approximately 3.5 cm overlap. The fractures extend into the distal femur and to the arthroplasty. Evaluation however is limited due to streak artifact caused by metallic knee arthroplasty. There is apparent marrow edema and hematoma at the fracture site.  A There is a total knee arthroplasty. The arthroplasty components appear intact and in anatomic alignment. The bones are osteopenic. Ligaments Suboptimally assessed by CT. Muscles and Tendons There is edema and small hematoma within the distal femoral musculature. No large fluid collections or hematoma. Soft tissues Diffuse soft tissue edema and stranding of the distal femur. IMPRESSION: Comminuted and displaced oblique fracture of the distal femur with extension into the femoral component of the arthroplasty. A large distal fracture fragment appears anterior and laterally displaced. There is mild lateral displacement and posterior angulation of the distal femur in relation to the  femoral shaft. The arthroplasty component appears intact and in anatomic alignment. Electronically Signed   By: Anner Crete M.D.   On: 01/31/2016 00:53   Dg Chest Portable 1 View  Result Date: 01/30/2016 CLINICAL DATA:  Fall today in nursing home. Preoperative evaluation. EXAM: PORTABLE CHEST 1 VIEW COMPARISON:  Chest radiograph Jul 06, 2015 FINDINGS: Cardiac silhouette is mildly enlarged, mediastinal silhouette is unremarkable, calcified aortic knob. LEFT lung base airspace opacity with hazy densities LEFT lung, LEFT greater than RIGHT apical pleural capping. RIGHT lung is clear. No pneumothorax. Osteopenia. Symmetric vascular calcifications in the neck. IMPRESSION: Stable cardiomegaly. Hazy density LEFT lung concerning for layering pleural effusion with LEFT lung base airspace opacity. Recommend follow-up PA and lateral views the chest when clinically able. Electronically Signed   By: Elon Alas M.D.   On: 01/30/2016 22:51   Dg C-arm 1-60 Min  Result Date: 01/31/2016 CLINICAL DATA:  ORIF distal right femur EXAM: DG C-ARM 61-120 MIN; RIGHT FEMUR 2 VIEWS COMPARISON:  Radiographs 01/30/2016 FINDINGS: Five low resolution intraoperative spot films of the right femur were obtained. Total fluoroscopy time was 2 minutes 36 seconds. A prior right knee replacement is noted. The images demonstrate surgical plate  multiple screw fixation of the distal right femoral fracture with anatomic alignment. Cerclage wires also noted at the distal femur. IMPRESSION: Intraoperative fluoroscopic assistance provided during ORIF of distal right femoral fracture. Electronically Signed   By: Donavan Foil M.D.   On: 01/31/2016 19:48   Dg Femur, Min 2 Views Right  Result Date: 01/31/2016 CLINICAL DATA:  ORIF distal right femur EXAM: DG C-ARM 61-120 MIN; RIGHT FEMUR 2 VIEWS COMPARISON:  Radiographs 01/30/2016 FINDINGS: Five low resolution intraoperative spot films of the right femur were obtained. Total fluoroscopy  time was 2 minutes 36 seconds. A prior right knee replacement is noted. The images demonstrate surgical plate multiple screw fixation of the distal right femoral fracture with anatomic alignment. Cerclage wires also noted at the distal femur. IMPRESSION: Intraoperative fluoroscopic assistance provided during ORIF of distal right femoral fracture. Electronically Signed   By: Donavan Foil M.D.   On: 01/31/2016 19:48   Dg Femur, Min 2 Views Right  Result Date: 01/30/2016 CLINICAL DATA:  82 year old female with fall and right femoral pain. EXAM: RIGHT FEMUR 2 VIEWS COMPARISON:  None. FINDINGS: There is a displaced oblique and comminuted fracture of the distal femur. There is extension of the fracture to the femoral component of the knee arthroplasty. There is posterior angulation and displacement of the main distal fracture fragment in relation to the femoral shaft. The bones are osteopenic. There is a total right knee arthroplasty. The arthroplasty components appear intact and in anatomic alignment. The soft tissues are grossly unremarkable. No significant joint effusion identified, however evaluation for the suprapatellar joint is somewhat limited due to superimposition of the fracture fragments on the cross-table lateral view. IMPRESSION: Comminuted and displaced oblique fracture of the distal femur with extension of the fracture to the femoral component of the arthroplasty. Orthopedic consult is advised. Electronically Signed   By: Anner Crete M.D.   On: 01/30/2016 22:47   Dg Femur [place] 2 Views Right  Result Date: 01/31/2016 CLINICAL DATA:  82 year old female with a history of fracture EXAM: RIGHT FEMUR PORTABLE 2 VIEW COMPARISON:  01/30/2016 FINDINGS: Postoperative changes of open reduction internal fixation of previous right femur fracture, with new lateral plate and screw buttress fixation with cerclage wire. Anatomic alignment has been restored post fixation. Small amount of gas within the  surgical bed. Right hip appears located. Re- demonstration of prior right knee arthroplasty. IMPRESSION: Early postoperative changes of ORIF of right distal femur fracture with plate and screw buttress fixation with cerclage wires, and restoration of anatomic alignment. Re- demonstration of prior surgical changes of right knee arthroplasty. Signed, Dulcy Fanny. Earleen Newport, DO [facility] Electronically Signed   By: Corrie Mckusick D.O.   On: 01/31/2016 20:54    (Echo, Carotid, EGD, Colonoscopy, ERCP)    Subjective:   Discharge Exam: Vitals:   02/05/16 0438 02/05/16 0810  BP: (!) 165/53 (!) 165/53  Pulse:  69  Resp:    Temp:     Vitals:   02/04/16 1928 02/05/16 0435 02/05/16 0438 02/05/16 0810  BP: 138/70 (!) 161/66 (!) 165/53 (!) 165/53  Pulse: 87 69  69  Resp:      Temp: 97.7 F (36.5 C) 98.3 F (36.8 C)    TempSrc: Oral Oral    SpO2: 97% 94%      General: Pt is alert, awake, not in acute distress Cardiovascular: RRR, S1/S2 +, no rubs, no gallops Respiratory: CTA bilaterally, no wheezing, no rhonchi Abdominal: Soft, NT, ND,  bowel sounds + Extremities: no edema, no cyanosis    The results of significant diagnostics from this hospitalization (including imaging, microbiology, ancillary and laboratory) are listed below for reference.     Microbiology: Recent Results (from the past 240 hour(s))  MRSA PCR Screening     Status: None   Collection Time: 01/31/16  7:25 AM  Result Value Ref Range Status   MRSA by PCR NEGATIVE NEGATIVE Final    Comment:        The GeneXpert MRSA Assay (FDA approved for NASAL specimens only), is one component of a comprehensive MRSA colonization surveillance program. It is not intended to diagnose MRSA infection nor to guide or monitor treatment for MRSA infections.      Labs: BNP (last 3 results) No results for input(s): BNP in the last 8760 hours. Basic Metabolic Panel:  Recent  Labs Lab 01/30/16 2352 01/31/16 0605 02/01/16 0513 02/02/16 0427 02/05/16 0433  NA 128* 131* 132* 132* 135  K 4.5 4.6 4.3 4.3 3.5  CL 98* 101 105 105 105  CO2 25 23 18* 24 23  GLUCOSE 162* 157* 113* 112* 99  BUN 29* 28* 24* 19 14  CREATININE 0.78 0.91 0.77 0.76 0.58  CALCIUM 8.5* 8.7* 7.8* 7.7* 7.9*   Liver Function Tests: No results for input(s): AST, ALT, ALKPHOS, BILITOT, PROT, ALBUMIN in the last 168 hours. No results for input(s): LIPASE, AMYLASE in the last 168 hours. No results for input(s): AMMONIA in the last 168 hours. CBC:  Recent Labs Lab 02/01/16 0513 02/02/16 0427 02/03/16 0414 02/04/16 0906 02/05/16 0433  WBC 6.7 6.1 6.1 5.2 4.6  HGB 9.4* 7.9* 7.3* 9.3* 9.5*  HCT 27.2* 22.7* 20.8* 27.1* 27.5*  MCV 88.6 88.0 88.5 87.7 87.0  PLT 105* 101* 104* 144* 177   Cardiac Enzymes: No results for input(s): CKTOTAL, CKMB, CKMBINDEX, TROPONINI in the last 168 hours. BNP: Invalid input(s): POCBNP CBG: No results for input(s): GLUCAP in the last 168 hours. D-Dimer No results for input(s): DDIMER in the last 72 hours. Hgb A1c No results for input(s): HGBA1C in the last 72 hours. Lipid Profile No results for input(s): CHOL, HDL, LDLCALC, TRIG, CHOLHDL, LDLDIRECT in the last 72 hours. Thyroid function studies No results for input(s): TSH, T4TOTAL, T3FREE, THYROIDAB in the last 72 hours.  Invalid input(s): FREET3 Anemia work up No results for input(s): VITAMINB12, FOLATE, FERRITIN, TIBC, IRON, RETICCTPCT in the last 72 hours. Urinalysis    Component Value Date/Time   COLORURINE YELLOW (A) 01/31/2016 0059   APPEARANCEUR CLEAR (A) 01/31/2016 0059   APPEARANCEUR Clear 07/11/2011 0011   LABSPEC 1.021 01/31/2016 0059   LABSPEC 1.006 07/11/2011 0011   PHURINE 6.0 01/31/2016 0059   GLUCOSEU NEGATIVE 01/31/2016 0059   GLUCOSEU Negative 07/11/2011 0011   HGBUR NEGATIVE 01/31/2016 0059   BILIRUBINUR NEGATIVE 01/31/2016 0059   BILIRUBINUR Negative 07/11/2011 0011    KETONESUR 5 (A) 01/31/2016 0059   PROTEINUR 30 (A) 01/31/2016 0059   NITRITE NEGATIVE 01/31/2016 0059   LEUKOCYTESUR NEGATIVE 01/31/2016 0059   LEUKOCYTESUR 3+ 07/11/2011 0011   Sepsis Labs Invalid input(s): PROCALCITONIN,  WBC,  LACTICIDVEN Microbiology Recent Results (from the past 240 hour(s))  MRSA PCR Screening     Status: None   Collection Time: 01/31/16  7:25 AM  Result Value Ref Range Status   MRSA by PCR NEGATIVE NEGATIVE Final    Comment:        The GeneXpert MRSA Assay (FDA approved for NASAL specimens only), is one component  of a comprehensive MRSA colonization surveillance program. It is not intended to diagnose MRSA infection nor to guide or monitor treatment for MRSA infections.      Time coordinating discharge: Over 30 minutes  SIGNED:   Nita Sells, MD  [facility] 02/05/2016, 10:24 AM Pager   If 7PM-7AM, please contact night-coverage [email or web address] Password [email or web address]

## 2016-02-05 NOTE — Clinical Social Work Note (Signed)
Clinical Social Worker facilitated patient discharge including contacting patient family and facility to confirm patient discharge plans.  Clinical information faxed to facility and family agreeable with plan.  CSW arranged ambulance transport via [facility] to [facility].  RN to call [phone] for report prior to discharge.  Clinical Social Worker will sign off for now as social work intervention is no longer needed. Please consult [place] again if new need arises.  [address], [place]

## 2016-02-05 NOTE — Progress Notes (Signed)
   Subjective:  Patient reports pain as mild to moderate.  No c/o.  Objective:   VITALS:   Vitals:   02/04/16 1928 02/05/16 0435 02/05/16 0438 02/05/16 0810  BP: 138/70 (!) 161/66 (!) 165/53 (!) 165/53  Pulse: 87 69  69  Resp:      Temp: 97.7 F (36.5 C) 98.3 F (36.8 C)    TempSrc: Oral Oral    SpO2: 97% 94%     NAD ABD soft Sensation intact distally Intact pulses distally Dorsiflexion/Plantar flexion intact Incision: dressing C/D/I Compartment soft   Lab Results  Component Value Date   WBC 4.6 02/05/2016   HGB 9.5 (L) 02/05/2016   HCT 27.5 (L) 02/05/2016   MCV 87.0 02/05/2016   PLT 177 02/05/2016   BMET    Component Value Date/Time   NA 135 02/05/2016 0433   NA 132 (L) 05/14/2012 1800   K 3.5 02/05/2016 0433   K 4.2 05/14/2012 1800   CL 105 02/05/2016 0433   CL 97 (L) 05/14/2012 1800   CO2 23 02/05/2016 0433   CO2 26 05/14/2012 1800   GLUCOSE 99 02/05/2016 0433   GLUCOSE 100 (H) 05/14/2012 1800   BUN 14 02/05/2016 0433   BUN 16 05/14/2012 1800   CREATININE 0.58 02/05/2016 0433   CREATININE 0.72 05/14/2012 1800   CALCIUM 7.9 (L) 02/05/2016 0433   CALCIUM 9.0 05/14/2012 1800   GFRNONAA >60 02/05/2016 0433   GFRNONAA >60 05/14/2012 1800   GFRAA >60 02/05/2016 0433   GFRAA >60 05/14/2012 1800     Assessment/Plan: 5 Days Post-Op   Principal Problem:   Closed fracture of right distal femur (HCC) Active Problems:   Essential hypertension   Hyponatremia   HLD (hyperlipidemia)   Normocytic normochromic anemia   History of stroke   Hip fracture (HCC)   Peri-prosthetic supracondylar fracture of femur   TDWB RLE PT/OT for bed --> chair xfers, ROM R knee DVT ppx: lovenox x30 days, SCDs, TEDs PO pain control D/C planning, SNF placement   Marishka Rentfrow, Horald Pollen 02/05/2016, 12:02 PM   Rod Can, MD Cell [phone]

## 2016-02-05 NOTE — Care Management Important Message (Signed)
Important Message  Patient Details  Name: [name] MRN: [id number] Date of Birth: 08-28-1923   Medicare Important Message Given:  Yes    Azalya Galyon 02/05/2016, 11:50 AM

## 2016-06-12 ENCOUNTER — Emergency Department
Admission: EM | Admit: 2016-06-12 | Discharge: 2016-06-12 | Disposition: A | Discharge status: Home / Self Care | Payer: Medicare Other | Attending: Emergency Medicine | Admitting: Emergency Medicine

## 2016-06-12 ENCOUNTER — Encounter: Payer: Self-pay | Admitting: Emergency Medicine

## 2016-06-12 DIAGNOSIS — Z791 Long term (current) use of non-steroidal anti-inflammatories (NSAID): Secondary | ICD-10-CM | POA: Insufficient documentation

## 2016-06-12 DIAGNOSIS — E871 Hypo-osmolality and hyponatremia: Secondary | ICD-10-CM | POA: Diagnosis not present

## 2016-06-12 DIAGNOSIS — I10 Essential (primary) hypertension: Secondary | ICD-10-CM | POA: Diagnosis not present

## 2016-06-12 DIAGNOSIS — R531 Weakness: Secondary | ICD-10-CM

## 2016-06-12 HISTORY — DX: Sciatica, unspecified side: M54.30

## 2016-06-12 HISTORY — DX: Spinal stenosis, site unspecified: M48.00

## 2016-06-12 HISTORY — DX: Hyperlipidemia, unspecified: E78.5

## 2016-06-12 HISTORY — DX: Anemia, unspecified: D64.9

## 2016-06-12 HISTORY — DX: Dysarthria and anarthria: R47.1

## 2016-06-12 HISTORY — DX: Hypo-osmolality and hyponatremia: E87.1

## 2016-06-12 HISTORY — DX: Other cerebrovascular vasospasm and vasoconstriction: I67.848

## 2016-06-12 HISTORY — DX: Pre-excitation syndrome: I45.6

## 2016-06-12 HISTORY — DX: Transient cerebral ischemic attack, unspecified: G45.9

## 2016-06-12 HISTORY — DX: Cognitive communication deficit: R41.841

## 2016-06-12 HISTORY — DX: Constipation, unspecified: K59.00

## 2016-06-12 HISTORY — DX: Vitamin deficiency, unspecified: E56.9

## 2016-06-12 LAB — CBC
HCT: 35.1 % (ref 35.0–47.0)
Hemoglobin: 12.1 g/dL (ref 12.0–16.0)
MCH: 30.8 pg (ref 26.0–34.0)
MCHC: 34.4 g/dL (ref 32.0–36.0)
MCV: 89.5 fL (ref 80.0–100.0)
PLATELETS: 215 10*3/uL (ref 150–440)
RBC: 3.92 MIL/uL (ref 3.80–5.20)
RDW: 15.8 % — AB (ref 11.5–14.5)
WBC: 4.9 10*3/uL (ref 3.6–11.0)

## 2016-06-12 LAB — URINALYSIS, COMPLETE (UACMP) WITH MICROSCOPIC
Bacteria, UA: NONE SEEN
Bilirubin Urine: NEGATIVE
GLUCOSE, UA: NEGATIVE mg/dL
KETONES UR: NEGATIVE mg/dL
LEUKOCYTES UA: NEGATIVE
Nitrite: NEGATIVE
PH: 7 (ref 5.0–8.0)
Protein, ur: NEGATIVE mg/dL
SQUAMOUS EPITHELIAL / LPF: NONE SEEN
Specific Gravity, Urine: 1.004 — ABNORMAL LOW (ref 1.005–1.030)

## 2016-06-12 LAB — BASIC METABOLIC PANEL
Anion gap: 6 (ref 5–15)
BUN: 11 mg/dL (ref 6–20)
CALCIUM: 8.7 mg/dL — AB (ref 8.9–10.3)
CHLORIDE: 97 mmol/L — AB (ref 101–111)
CO2: 27 mmol/L (ref 22–32)
CREATININE: 0.47 mg/dL (ref 0.44–1.00)
GFR calc non Af Amer: >60 mL/min (ref >60)
Glucose, Bld: 88 mg/dL (ref 65–99)
Potassium: 3.9 mmol/L (ref 3.5–5.1)
SODIUM: 130 mmol/L — AB (ref 135–145)

## 2016-06-12 NOTE — ED Triage Notes (Signed)
Pt presnets to ED via [facility] from [facility], per EMS they were called out for [facility], pt denies at this time. EMS states they were also told that patient was unstable, then she could not talk and then she could talk. Per EMS [facility] had initial BP of 77/44, FD on scene 152/70, and EMS got a BP of 181/96 en route. Pt presents in NAD at this time, is calm cooperative and pleasant with staff.

## 2016-06-12 NOTE — ED Notes (Signed)
In and out cath performed by this RN, Maudie Mercury, RN and Morey Hummingbird, EDT. Pt tolerated well.

## 2016-06-12 NOTE — Discharge Instructions (Signed)
Your blood tests reveal a slightly low sodium level of 130. Increase your salt intake to maintain better hydration. Your urine test is negative today. Follow-up with your primary care doctor for continued monitoring of your symptoms.

## 2016-06-12 NOTE — ED Notes (Signed)
Pt taken to another room and changed, placed in clean brief, and her pants placed back on. Pt tolerated well at this time.

## 2016-06-12 NOTE — ED Notes (Signed)
Pt visualized in the bed at this time with family at bedside. NAD noted at this time. Will continue to monitor for further patient needs.

## 2016-06-12 NOTE — ED Provider Notes (Signed)
[facility] Emergency Department Provider Note  ____________________________________________  Time seen: Approximately 2:38 PM  I have reviewed the triage vital signs and the nursing notes.   HISTORY  Chief Complaint Weakness    HPI [name] is a 83 y.o. female sent [place] the [place] due to concern for generalized weakness. They also reported a low blood pressure, but first responders found blood pressure to be essentially normal, confirmed by EMS. Patient denies any acute complaints and feels well. She states she has been in her usual state of health but has been limiting her salt intake recently out of a concern for her blood pressure management.  She also reports that she typically has difficulty speaking in the morning due to hoarseness and today was similar but that quickly resolved and she has no concerns about it.  EMS reported to triage that they had been called for shortness of breath, but the patient denies any shortness of breath.   Past Medical History:  Diagnosis Date  . Anemia   . Cerebrovascular spasm   . Cognitive communication deficit   . Constipation   . Dysarthria and anarthria   . Hyperlipidemia   . Hypertension   . Hypo-osmolality and hyponatremia   . Pre-excitation syndrome   . Sciatica   . Spinal stenosis   . TIA (transient ischemic attack)   . Vitamin deficiency   . WPW (Wolff-Parkinson-White syndrome)      Patient Active Problem List   Diagnosis Date Noted  . Closed fracture of right distal femur ([place]) 01/31/2016  . HLD (hyperlipidemia) 01/31/2016  . Normocytic normochromic anemia 01/31/2016  . History of stroke 01/31/2016  . Hip fracture ([place]) 01/31/2016  . Peri-prosthetic supracondylar fracture of femur 01/31/2016  . CVA (cerebral infarction) 11/06/2015  . Essential hypertension 11/06/2015  . Hyponatremia 11/06/2015  . Hyperglycemia 11/06/2015     Past Surgical History:  Procedure Laterality Date   . MASTECTOMY    . ORIF FEMUR FRACTURE Right 01/31/2016   Procedure: OPEN REDUCTION INTERNAL FIXATION DISTAL FEMUR FRACTURE;  Surgeon: Rod Can, MD;  Location: [facility];  Service: Orthopedics;  Laterality: Right;  . REPLACEMENT TOTAL KNEE       Prior to Admission medications   Medication Sig Start Date End Date Taking? Authorizing Provider  acetaminophen (TYLENOL) 500 MG tablet Take 1,000 mg by mouth 2 (two) times daily as needed for moderate pain.    Yes Historical Provider, MD  atorvastatin (LIPITOR) 40 MG tablet Take 1 tablet (40 mg total) by mouth daily at 6 PM. 11/07/15  Yes Loletha Grayer, MD  benazepril (LOTENSIN) 40 MG tablet Take 40 mg by mouth daily.   Yes Historical Provider, MD  clopidogrel (PLAVIX) 75 MG tablet Take 1 tablet (75 mg total) by mouth daily. 11/08/15  Yes Loletha Grayer, MD  HYDROcodone-acetaminophen (NORCO/VICODIN) 5-325 MG tablet Take 1-2 tablets by mouth every 6 (six) hours as needed for moderate pain. 02/05/16  Yes Nita Sells, MD  ibuprofen (ADVIL,MOTRIN) 200 MG tablet Take 400 mg by mouth every 6 (six) hours as needed for moderate pain.    Yes Historical Provider, MD  metoprolol succinate (TOPROL-XL) 50 MG 24 hr tablet Take 50 mg by mouth daily. Take with or immediately following a meal.   Yes Historical Provider, MD  omeprazole (PRILOSEC) 20 MG capsule Take 20 mg by mouth daily.   Yes Historical Provider, MD  polyethylene glycol (MIRALAX / GLYCOLAX) packet Take 17 g by mouth daily as needed  for mild constipation. 02/05/16  Yes Nita Sells, MD  senna (SENOKOT) 8.6 MG TABS tablet Take 1 tablet (8.6 mg total) by mouth 2 (two) times daily. 02/05/16  Yes Nita Sells, MD  sertraline (ZOLOFT) 50 MG tablet Take 50 mg by mouth daily.   Yes Historical Provider, MD  enoxaparin (LOVENOX) 40 MG/0.4ML injection Inject 0.4 mLs (40 mg total) into the skin daily. Patient not taking: Reported on 06/12/2016 02/06/16   Nita Sells, MD      Allergies Patient has no known allergies.   Family History  Problem Relation Age of Onset  . Lymphoma Brother   . CAD Brother   . Prostate cancer Brother   . Cancer Mother   . Cancer Father     Social History Social History  Substance Use Topics  . Smoking status: Never Smoker  . Smokeless tobacco: Never Used  . Alcohol use Not on file    Review of Systems  Constitutional:   No fever or chills.  ENT:   No sore throat. No rhinorrhea. Lymphatic: No swollen glands, No extremity swelling Endocrine: No hot/cold flashes. No significant weight change. No neck swelling. Cardiovascular:   No chest pain or syncope. Respiratory:   No dyspnea or cough. Gastrointestinal:   Negative for abdominal pain, vomiting and diarrhea.  Genitourinary:   Negative for dysuria or difficulty urinating. Musculoskeletal:   Negative for focal pain or swelling Neurological:   Negative for headaches or weakness. All other systems reviewed and are negative except as documented above in ROS and HPI.  ____________________________________________   PHYSICAL EXAM:  VITAL SIGNS: ED Triage Vitals  Enc Vitals Group     BP 06/12/16 1244 (!) 159/54     Pulse Rate 06/12/16 1244 69     Resp 06/12/16 1244 18     Temp 06/12/16 1244 98.4 F (36.9 C)     Temp Source 06/12/16 1244 Oral     SpO2 06/12/16 1244 98 %     Weight 06/12/16 1245 117 lb 6.4 oz (53.3 kg)     Height 06/12/16 1245 5\' 2"  (1.575 m)     Head Circumference --      Peak Flow --      Pain Score --      Pain Loc --      Pain Edu? --      Excl. in [place]? --     Vital signs reviewed, nursing assessments reviewed.   Constitutional:   Alert and oriented. Well appearing and in no distress. Eyes:   No scleral icterus. No conjunctival pallor. PERRL. EOMI.  No nystagmus. ENT   Head:   Normocephalic and atraumatic.   Nose:   No congestion/rhinnorhea. No septal hematoma   Mouth/Throat:   MMM, no pharyngeal erythema. No  peritonsillar mass.    Neck:   No stridor. No SubQ emphysema. No meningismus. Hematological/Lymphatic/Immunilogical:   No cervical lymphadenopathy. Cardiovascular:   RRR. Symmetric bilateral radial and DP pulses.  No murmurs.  Respiratory:   Normal respiratory effort without tachypnea nor retractions. Breath sounds are clear and equal bilaterally. No wheezes/rales/rhonchi. Gastrointestinal:   Soft and nontender. Non distended. There is no CVA tenderness.  No rebound, rigidity, or guarding. Genitourinary:   deferred Musculoskeletal:   Normal range of motion in all extremities. No joint effusions.  No lower extremity tenderness.  No edema. Neurologic:   Normal speech and language.  CN 2-10 normal. Motor grossly intact. No gross focal neurologic deficits are appreciated.  Skin:  Skin is warm, dry and intact. No rash noted.  No petechiae, purpura, or bullae.  ____________________________________________    LABS (pertinent positives/negatives) (all labs ordered are listed, but only abnormal results are displayed) Labs Reviewed  BASIC METABOLIC PANEL - Abnormal; Notable for the following:       Result Value   Sodium 130 (*)    Chloride 97 (*)    Calcium 8.7 (*)    All other components within normal limits  CBC - Abnormal; Notable for the following:    RDW 15.8 (*)    All other components within normal limits  URINALYSIS, COMPLETE (UACMP) WITH MICROSCOPIC - Abnormal; Notable for the following:    Color, Urine STRAW (*)    APPearance CLEAR (*)    Specific Gravity, Urine 1.004 (*)    Hgb urine dipstick SMALL (*)    All other components within normal limits   ____________________________________________   EKG  Interpreted by me Sinus rhythm rate of 61, normal axis and intervals. Right bundle branch block associated repolarization of the mallet. No acute ischemic changes.  ____________________________________________    RADIOLOGY  No results  found.  ____________________________________________   PROCEDURES Procedures  ____________________________________________   INITIAL IMPRESSION / ASSESSMENT AND PLAN / ED COURSE  Pertinent labs & imaging results that were available during my care of the patient were reviewed by me and considered in my medical decision making (see chart for details).  Patient well appearing no acute distress, presents with generalized weakness. Workup negative, vital signs unremarkable in the ED. Labs unremarkable, no urinary tract infection. Discharge home to follow up with primary care. [facility] unable to provide transport so patient will have to be returned via EMS.  Low suspicion for ACS PE dissection pneumonia and pneumothorax sepsis urinary tract infection pyelonephritis soft tissue infection meningitis encephalitis or stroke. Patient and son counseled on her slightly low sodium level of 1:30 and to increase salt intake. This may be contributed to some mild dehydration which can cause her to feel weaker.         ____________________________________________   FINAL CLINICAL IMPRESSION(S) / ED DIAGNOSES  Final diagnoses:  Weakness  Hyponatremia      New Prescriptions   No medications on file     Portions of this note were generated with dragon dictation software. Dictation errors may occur despite best attempts at proofreading.    Carrie Mew, MD 06/12/16 [phone]

## 2016-06-12 NOTE — ED Notes (Signed)
Pt given water and graham crackers and saltines per son's request. Pt eating in bed, no change in patient condition. Pt is alert and oriented at this time.

## 2016-06-12 NOTE — ED Notes (Signed)
Pt visualized in NAD at this time. Continues to wait in the bed for EMS transport. Del[place] explained and apologized for by this RN to patient's son. Will continue to monitor for further patient needs.

## 2016-06-12 NOTE — ED Notes (Signed)
Report given to [place], Therapist, sports at [facility].

## 2016-08-16 DIAGNOSIS — J189 Pneumonia, unspecified organism: Secondary | ICD-10-CM

## 2016-08-16 HISTORY — DX: Pneumonia, unspecified organism: J18.9

## 2016-08-19 ENCOUNTER — Encounter: Payer: Self-pay | Admitting: *Deleted

## 2016-08-20 NOTE — Discharge Instructions (Signed)

## 2016-08-28 ENCOUNTER — Ambulatory Visit: Payer: Medicare Other | Admitting: Anesthesiology

## 2016-08-28 ENCOUNTER — Ambulatory Visit
Admission: RE | Admit: 2016-08-28 | Discharge: 2016-08-28 | Disposition: A | Discharge status: Home / Self Care | Payer: Medicare Other | Source: Ambulatory Visit | Attending: Ophthalmology | Admitting: Ophthalmology

## 2016-08-28 ENCOUNTER — Encounter
Admission: RE | Disposition: A | Discharge status: Home / Self Care | Payer: Self-pay | Source: Ambulatory Visit | Attending: Ophthalmology

## 2016-08-28 ENCOUNTER — Encounter: Payer: Self-pay | Admitting: *Deleted

## 2016-08-28 DIAGNOSIS — F329 Major depressive disorder, single episode, unspecified: Secondary | ICD-10-CM | POA: Insufficient documentation

## 2016-08-28 DIAGNOSIS — E7800 Pure hypercholesterolemia, unspecified: Secondary | ICD-10-CM | POA: Diagnosis not present

## 2016-08-28 DIAGNOSIS — H2512 Age-related nuclear cataract, left eye: Secondary | ICD-10-CM | POA: Insufficient documentation

## 2016-08-28 DIAGNOSIS — I10 Essential (primary) hypertension: Secondary | ICD-10-CM | POA: Diagnosis not present

## 2016-08-28 DIAGNOSIS — Z8673 Personal history of transient ischemic attack (TIA), and cerebral infarction without residual deficits: Secondary | ICD-10-CM | POA: Insufficient documentation

## 2016-08-28 DIAGNOSIS — I739 Peripheral vascular disease, unspecified: Secondary | ICD-10-CM | POA: Insufficient documentation

## 2016-08-28 DIAGNOSIS — Z993 Dependence on wheelchair: Secondary | ICD-10-CM | POA: Insufficient documentation

## 2016-08-28 DIAGNOSIS — K219 Gastro-esophageal reflux disease without esophagitis: Secondary | ICD-10-CM | POA: Diagnosis not present

## 2016-08-28 DIAGNOSIS — M810 Age-related osteoporosis without current pathological fracture: Secondary | ICD-10-CM | POA: Diagnosis not present

## 2016-08-28 HISTORY — DX: Gastro-esophageal reflux disease without esophagitis: K21.9

## 2016-08-28 HISTORY — PX: CATARACT EXTRACTION W/PHACO: SHX586

## 2016-08-28 HISTORY — DX: Pneumonia, unspecified organism: J18.9

## 2016-08-28 SURGERY — PHACOEMULSIFICATION, CATARACT, WITH IOL INSERTION
Anesthesia: Monitor Anesthesia Care | Laterality: Left | Wound class: Clean

## 2016-08-28 MED ORDER — ACETAMINOPHEN 325 MG PO TABS: 325.0000 mg | ORAL_TABLET | ORAL | Status: DC | PRN

## 2016-08-28 MED ORDER — CEFUROXIME OPHTHALMIC INJECTION 1 MG/0.1 ML
INJECTION | OPHTHALMIC | Status: DC | PRN
  Administered 2016-08-28: 0.1 mL via INTRACAMERAL

## 2016-08-28 MED ORDER — ACETAMINOPHEN 160 MG/5ML PO SOLN: 325.0000 mg | ORAL | Status: DC | PRN

## 2016-08-28 MED ORDER — LACTATED RINGERS IV SOLN: INTRAVENOUS | Status: DC

## 2016-08-28 MED ORDER — MOXIFLOXACIN HCL 0.5 % OP SOLN
1.0000 [drp] | OPHTHALMIC | Status: DC | PRN
  Administered 2016-08-28 (×3): 1 [drp] via OPHTHALMIC

## 2016-08-28 MED ORDER — MIDAZOLAM HCL 2 MG/2ML IJ SOLN
INTRAMUSCULAR | Status: DC | PRN
  Administered 2016-08-28: 1 mg via INTRAVENOUS

## 2016-08-28 MED ORDER — FENTANYL CITRATE (PF) 100 MCG/2ML IJ SOLN
INTRAMUSCULAR | Status: DC | PRN
  Administered 2016-08-28: 50 ug via INTRAVENOUS

## 2016-08-28 MED ORDER — ARMC OPHTHALMIC DILATING DROPS
1.0000 "application " | OPHTHALMIC | Status: DC | PRN
  Administered 2016-08-28 (×3): 1 via OPHTHALMIC

## 2016-08-28 MED ORDER — LIDOCAINE HCL 2 % IJ SOLN
INTRAMUSCULAR | Status: DC | PRN
  Administered 2016-08-28: 1 mL

## 2016-08-28 MED ORDER — NA HYALUR & NA CHOND-NA HYALUR 0.4-0.35 ML IO KIT
PACK | INTRAOCULAR | Status: DC | PRN
  Administered 2016-08-28: 1 mL via INTRAOCULAR

## 2016-08-28 MED ORDER — BRIMONIDINE TARTRATE-TIMOLOL 0.2-0.5 % OP SOLN
OPHTHALMIC | Status: DC | PRN
  Administered 2016-08-28: 1 [drp] via OPHTHALMIC

## 2016-08-28 SURGICAL SUPPLY — 25 items
CANNULA ANT/CHMB 27GA (MISCELLANEOUS) ×3 IMPLANT
CARTRIDGE ABBOTT (MISCELLANEOUS) IMPLANT
GLOVE SURG LX 7.5 STRW (GLOVE) ×2
GLOVE SURG LX STRL 7.5 STRW (GLOVE) ×1 IMPLANT
GLOVE SURG TRIUMPH 8.0 PF LTX (GLOVE) ×3 IMPLANT
GOWN STRL REUS W/ TWL LRG LVL3 (GOWN DISPOSABLE) ×2 IMPLANT
GOWN STRL REUS W/TWL LRG LVL3 (GOWN DISPOSABLE) ×4
LENS IOL TECNIS ITEC 23.5 (Intraocular Lens) ×3 IMPLANT
MARKER SKIN DUAL TIP RULER LAB (MISCELLANEOUS) ×3 IMPLANT
NDL RETROBULBAR .5 NSTRL (NEEDLE) IMPLANT
NEEDLE FILTER BLUNT 18X 1/2SAF (NEEDLE) ×2
NEEDLE FILTER BLUNT 18X1 1/2 (NEEDLE) ×1 IMPLANT
PACK CATARACT BRASINGTON (MISCELLANEOUS) ×3 IMPLANT
PACK EYE AFTER SURG (MISCELLANEOUS) ×3 IMPLANT
PACK OPTHALMIC (MISCELLANEOUS) ×3 IMPLANT
RING MALYGIN 7.0 (MISCELLANEOUS) IMPLANT
SUT ETHILON 10-0 CS-B-6CS-B-6 (SUTURE)
SUT VICRYL  9 0 (SUTURE)
SUT VICRYL 9 0 (SUTURE) IMPLANT
SUTURE EHLN 10-0 CS-B-6CS-B-6 (SUTURE) IMPLANT
SYR 3ML LL SCALE MARK (SYRINGE) ×3 IMPLANT
SYR 5ML LL (SYRINGE) ×3 IMPLANT
SYR TB 1ML LUER SLIP (SYRINGE) ×3 IMPLANT
WATER STERILE IRR 250ML POUR (IV SOLUTION) ×3 IMPLANT
WIPE NON LINTING 3.25X3.25 (MISCELLANEOUS) ×3 IMPLANT

## 2016-08-28 NOTE — Anesthesia Postprocedure Evaluation (Signed)
Anesthesia Post Note  Patient: [name]  Procedure(s) Performed: Procedure(s) (LRB): CATARACT EXTRACTION PHACO AND INTRAOCULAR LENS PLACEMENT (IOC)  Left (Left)  Patient location during evaluation: PACU Anesthesia Type: MAC Level of consciousness: awake and alert, oriented and patient cooperative Pain management: pain level controlled Vital Signs Assessment: post-procedure vital signs reviewed and stable Respiratory status: spontaneous breathing, nonlabored ventilation and respiratory function stable Cardiovascular status: blood pressure returned to baseline and stable Postop Assessment: adequate PO intake Anesthetic complications: no    Darrin Nipper

## 2016-08-28 NOTE — Anesthesia Preprocedure Evaluation (Signed)
Anesthesia Evaluation  Patient identified by MRN, date of birth, ID band Patient awake    Reviewed: Allergy & Precautions, NPO status , Patient's Chart, lab work & pertinent test results  History of Anesthesia Complications Negative for: history of anesthetic complications  Airway Mallampati: I  TM Distance: >3 FB Neck ROM: Full    Dental no notable dental hx.    Pulmonary neg pulmonary ROS,    Pulmonary exam normal breath sounds clear to auscultation       Cardiovascular hypertension, + Peripheral Vascular Disease   Rhythm:Regular Rate:Normal + Systolic murmurs WPW   Neuro/Psych Wheelchair-bound  TIA Neuromuscular disease    GI/Hepatic negative GI ROS, Neg liver ROS,   Endo/Other  negative endocrine ROS  Renal/GU negative Renal ROS     Musculoskeletal   Abdominal   Peds  Hematology  (+) anemia ,   Anesthesia Other Findings   Reproductive/Obstetrics                             Anesthesia Physical Anesthesia Plan  ASA: III  Anesthesia Plan: MAC   Post-op Pain Management:    Induction: Intravenous  PONV Risk Score and Plan:   Airway Management Planned:   Additional Equipment:   Intra-op Plan:   Post-operative Plan:   Informed Consent: I have reviewed the patients History and Physical, chart, labs and discussed the procedure including the risks, benefits and alternatives for the proposed anesthesia with the patient or authorized representative who has indicated his/her understanding and acceptance.     Plan Discussed with:   Anesthesia Plan Comments:         Anesthesia Quick Evaluation

## 2016-08-28 NOTE — Transfer of Care (Signed)
Immediate Anesthesia Transfer of Care Note  Patient: [name]  Procedure(s) Performed: Procedure(s) with comments: CATARACT EXTRACTION PHACO AND INTRAOCULAR LENS PLACEMENT ([place])  Left (Left) - Patient needs wheelchair  Patient Location: PACU  Anesthesia Type: MAC  Level of Consciousness: awake, alert  and patient cooperative  Airway and Oxygen Therapy: Patient Spontanous Breathing and Patient connected to supplemental oxygen  Post-op Assessment: Post-op Vital signs reviewed, Patient's Cardiovascular Status Stable, Respiratory Function Stable, Patent Airway and No signs of Nausea or vomiting  Post-op Vital Signs: Reviewed and stable  Complications: No apparent anesthesia complications

## 2016-08-28 NOTE — H&P (Signed)
The History and Physical notes are on paper, have been signed, and are to be scanned. The patient remains stable and unchanged from the H&P.   Previous H&P reviewed, patient examined, and there are no changes.  [name] 08/28/2016 9:15 AM

## 2016-08-28 NOTE — Anesthesia Procedure Notes (Signed)
Procedure Name: MAC Performed by: Therisa Mennella Pre-anesthesia Checklist: Patient identified, Emergency Drugs available, Suction available, Timeout performed and Patient being monitored Patient Re-evaluated:Patient Re-evaluated prior to inductionOxygen Delivery Method: Nasal cannula Placement Confirmation: positive ETCO2     

## 2016-08-28 NOTE — Op Note (Signed)
OPERATIVE NOTE  [name] [id number] 08/28/2016   PREOPERATIVE DIAGNOSIS:  Nuclear sclerotic cataract left eye. H25.12   POSTOPERATIVE DIAGNOSIS:    Nuclear sclerotic cataract left eye.     PROCEDURE:  Phacoemusification with posterior chamber intraocular lens placement of the left eye   LENS:   Implant Name Type Inv. Item Serial No. Manufacturer Lot No. LRB No. Used  LENS IOL DIOP 23.5 - [id number] Intraocular Lens LENS IOL DIOP 23.5 [id number] AMO   Left 1        ULTRASOUND TIME: 15  % of 1 minutes 5 seconds, CDE 9.9  SURGEON:  Wyonia Hough, MD   ANESTHESIA:  Topical with tetracaine drops and 2% Xylocaine jelly, augmented with 1% preservative-free intracameral lidocaine.    COMPLICATIONS:  None.   DESCRIPTION OF PROCEDURE:  The patient was identified in the holding room and transported to the operating room and placed in the supine position under the operating microscope.  The left eye was identified as the operative eye and it was prepped and draped in the usual sterile ophthalmic fashion.   A 1 millimeter clear-corneal paracentesis was made at the 1:30 position.  0.5 ml of preservative-free 1% lidocaine was injected into the anterior chamber.  The anterior chamber was filled with Viscoat viscoelastic.  A 2.4 millimeter keratome was used to make a near-clear corneal incision at the 10:30 position.  .  A curvilinear capsulorrhexis was made with a cystotome and capsulorrhexis forceps.  Balanced salt solution was used to hydrodissect and hydrodelineate the nucleus.   Phacoemulsification was then used in stop and chop fashion to remove the lens nucleus and epinucleus.  The remaining cortex was then removed using the irrigation and aspiration handpiece. Provisc was then placed into the capsular bag to distend it for lens placement.  A lens was then injected into the capsular bag.  The remaining viscoelastic was aspirated.   Wounds were hydrated with balanced salt solution.   The anterior chamber was inflated to a physiologic pressure with balanced salt solution.  No wound leaks were noted. Cefuroxime 0.1 ml of a 10mg /ml solution was injected into the anterior chamber for a dose of 1 mg of intracameral antibiotic at the completion of the case.   Timolol and Brimonidine drops were applied to the eye.  The patient was taken to the recovery room in stable condition without complications of anesthesia or surgery.  [name] 08/28/2016, 10:01 AM

## 2016-12-05 NOTE — Discharge Instructions (Signed)

## 2016-12-09 ENCOUNTER — Ambulatory Visit: Payer: Medicare Other | Admitting: Anesthesiology

## 2016-12-09 ENCOUNTER — Encounter
Admission: RE | Disposition: A | Discharge status: Home / Self Care | Payer: Self-pay | Source: Ambulatory Visit | Attending: Ophthalmology

## 2016-12-09 ENCOUNTER — Ambulatory Visit
Admission: RE | Admit: 2016-12-09 | Discharge: 2016-12-09 | Disposition: A | Discharge status: Home / Self Care | Payer: Medicare Other | Source: Ambulatory Visit | Attending: Ophthalmology | Admitting: Ophthalmology

## 2016-12-09 DIAGNOSIS — K219 Gastro-esophageal reflux disease without esophagitis: Secondary | ICD-10-CM | POA: Insufficient documentation

## 2016-12-09 DIAGNOSIS — E7800 Pure hypercholesterolemia, unspecified: Secondary | ICD-10-CM | POA: Diagnosis not present

## 2016-12-09 DIAGNOSIS — Z9012 Acquired absence of left breast and nipple: Secondary | ICD-10-CM | POA: Diagnosis not present

## 2016-12-09 DIAGNOSIS — F329 Major depressive disorder, single episode, unspecified: Secondary | ICD-10-CM | POA: Diagnosis not present

## 2016-12-09 DIAGNOSIS — I10 Essential (primary) hypertension: Secondary | ICD-10-CM | POA: Diagnosis not present

## 2016-12-09 DIAGNOSIS — Z993 Dependence on wheelchair: Secondary | ICD-10-CM | POA: Insufficient documentation

## 2016-12-09 DIAGNOSIS — Z96651 Presence of right artificial knee joint: Secondary | ICD-10-CM | POA: Diagnosis not present

## 2016-12-09 DIAGNOSIS — H2511 Age-related nuclear cataract, right eye: Secondary | ICD-10-CM | POA: Diagnosis not present

## 2016-12-09 DIAGNOSIS — M1990 Unspecified osteoarthritis, unspecified site: Secondary | ICD-10-CM | POA: Diagnosis not present

## 2016-12-09 DIAGNOSIS — I739 Peripheral vascular disease, unspecified: Secondary | ICD-10-CM | POA: Diagnosis not present

## 2016-12-09 DIAGNOSIS — N3281 Overactive bladder: Secondary | ICD-10-CM | POA: Diagnosis not present

## 2016-12-09 DIAGNOSIS — Z79899 Other long term (current) drug therapy: Secondary | ICD-10-CM | POA: Insufficient documentation

## 2016-12-09 DIAGNOSIS — G709 Myoneural disorder, unspecified: Secondary | ICD-10-CM | POA: Insufficient documentation

## 2016-12-09 DIAGNOSIS — K589 Irritable bowel syndrome without diarrhea: Secondary | ICD-10-CM | POA: Diagnosis not present

## 2016-12-09 DIAGNOSIS — M810 Age-related osteoporosis without current pathological fracture: Secondary | ICD-10-CM | POA: Insufficient documentation

## 2016-12-09 DIAGNOSIS — R011 Cardiac murmur, unspecified: Secondary | ICD-10-CM | POA: Insufficient documentation

## 2016-12-09 DIAGNOSIS — Z8673 Personal history of transient ischemic attack (TIA), and cerebral infarction without residual deficits: Secondary | ICD-10-CM | POA: Insufficient documentation

## 2016-12-09 HISTORY — DX: Malignant (primary) neoplasm, unspecified: C80.1

## 2016-12-09 HISTORY — DX: Irritable bowel syndrome, unspecified: K58.9

## 2016-12-09 HISTORY — PX: CATARACT EXTRACTION W/PHACO: SHX586

## 2016-12-09 HISTORY — DX: Dysphagia, unspecified: R13.10

## 2016-12-09 HISTORY — DX: Unspecified osteoarthritis, unspecified site: M19.90

## 2016-12-09 HISTORY — DX: Unspecified hearing loss, unspecified ear: H91.90

## 2016-12-09 HISTORY — DX: Overactive bladder: N32.81

## 2016-12-09 HISTORY — DX: Depression, unspecified: F32.A

## 2016-12-09 HISTORY — DX: Major depressive disorder, single episode, unspecified: F32.9

## 2016-12-09 HISTORY — DX: Age-related osteoporosis without current pathological fracture: M81.0

## 2016-12-09 SURGERY — PHACOEMULSIFICATION, CATARACT, WITH IOL INSERTION
Anesthesia: Monitor Anesthesia Care | Laterality: Right | Wound class: Clean

## 2016-12-09 MED ORDER — LIDOCAINE HCL (PF) 2 % IJ SOLN
INTRAOCULAR | Status: DC | PRN
  Administered 2016-12-09: 1 mL via INTRAMUSCULAR

## 2016-12-09 MED ORDER — BRIMONIDINE TARTRATE-TIMOLOL 0.2-0.5 % OP SOLN
OPHTHALMIC | Status: DC | PRN
  Administered 2016-12-09: 1 [drp] via OPHTHALMIC

## 2016-12-09 MED ORDER — LACTATED RINGERS IV SOLN: 10.0000 mL/h | INTRAVENOUS | Status: DC

## 2016-12-09 MED ORDER — ARMC OPHTHALMIC DILATING DROPS
1.0000 "application " | OPHTHALMIC | Status: DC | PRN
  Administered 2016-12-09 (×3): 1 via OPHTHALMIC

## 2016-12-09 MED ORDER — EPINEPHRINE PF 1 MG/ML IJ SOLN
INTRAMUSCULAR | Status: DC | PRN
  Administered 2016-12-09: 57 mL via OPHTHALMIC

## 2016-12-09 MED ORDER — CEFUROXIME OPHTHALMIC INJECTION 1 MG/0.1 ML
INJECTION | OPHTHALMIC | Status: DC | PRN
  Administered 2016-12-09: 1 mg via INTRACAMERAL

## 2016-12-09 MED ORDER — ACETAMINOPHEN 325 MG PO TABS: 325.0000 mg | ORAL_TABLET | ORAL | Status: DC | PRN

## 2016-12-09 MED ORDER — MIDAZOLAM HCL 2 MG/2ML IJ SOLN
INTRAMUSCULAR | Status: DC | PRN
  Administered 2016-12-09: 0.5 mg via INTRAVENOUS

## 2016-12-09 MED ORDER — NA HYALUR & NA CHOND-NA HYALUR 0.4-0.35 ML IO KIT
PACK | INTRAOCULAR | Status: DC | PRN
  Administered 2016-12-09: 1 mL via INTRAOCULAR

## 2016-12-09 MED ORDER — ACETAMINOPHEN 160 MG/5ML PO SOLN: 325.0000 mg | ORAL | Status: DC | PRN

## 2016-12-09 MED ORDER — MOXIFLOXACIN HCL 0.5 % OP SOLN
1.0000 [drp] | OPHTHALMIC | Status: DC | PRN
  Administered 2016-12-09 (×3): 1 [drp] via OPHTHALMIC

## 2016-12-09 MED ORDER — FENTANYL CITRATE (PF) 100 MCG/2ML IJ SOLN
INTRAMUSCULAR | Status: DC | PRN
  Administered 2016-12-09: 25 ug via INTRAVENOUS

## 2016-12-09 MED ORDER — ONDANSETRON HCL 4 MG/2ML IJ SOLN: 4.0000 mg | Freq: Once | INTRAMUSCULAR | Status: DC | PRN

## 2016-12-09 SURGICAL SUPPLY — 25 items
CANNULA ANT/CHMB 27GA (MISCELLANEOUS) ×3 IMPLANT
CARTRIDGE ABBOTT (MISCELLANEOUS) IMPLANT
GLOVE SURG LX 7.5 STRW (GLOVE) ×2
GLOVE SURG LX STRL 7.5 STRW (GLOVE) ×1 IMPLANT
GLOVE SURG TRIUMPH 8.0 PF LTX (GLOVE) ×3 IMPLANT
GOWN STRL REUS W/ TWL LRG LVL3 (GOWN DISPOSABLE) ×2 IMPLANT
GOWN STRL REUS W/TWL LRG LVL3 (GOWN DISPOSABLE) ×4
LENS IOL TECNIS ITEC 23.0 (Intraocular Lens) ×3 IMPLANT
MARKER SKIN DUAL TIP RULER LAB (MISCELLANEOUS) ×3 IMPLANT
NDL RETROBULBAR .5 NSTRL (NEEDLE) IMPLANT
NEEDLE FILTER BLUNT 18X 1/2SAF (NEEDLE) ×2
NEEDLE FILTER BLUNT 18X1 1/2 (NEEDLE) ×1 IMPLANT
PACK CATARACT BRASINGTON (MISCELLANEOUS) ×3 IMPLANT
PACK EYE AFTER SURG (MISCELLANEOUS) ×3 IMPLANT
PACK OPTHALMIC (MISCELLANEOUS) ×3 IMPLANT
RING MALYGIN 7.0 (MISCELLANEOUS) IMPLANT
SUT ETHILON 10-0 CS-B-6CS-B-6 (SUTURE)
SUT VICRYL  9 0 (SUTURE)
SUT VICRYL 9 0 (SUTURE) IMPLANT
SUTURE EHLN 10-0 CS-B-6CS-B-6 (SUTURE) IMPLANT
SYR 3ML LL SCALE MARK (SYRINGE) ×3 IMPLANT
SYR 5ML LL (SYRINGE) ×3 IMPLANT
SYR TB 1ML LUER SLIP (SYRINGE) ×3 IMPLANT
WATER STERILE IRR 250ML POUR (IV SOLUTION) ×3 IMPLANT
WIPE NON LINTING 3.25X3.25 (MISCELLANEOUS) ×3 IMPLANT

## 2016-12-09 NOTE — Anesthesia Postprocedure Evaluation (Signed)
Anesthesia Post Note  Patient: [name]  Procedure(s) Performed: CATARACT EXTRACTION PHACO AND INTRAOCULAR LENS PLACEMENT (IOC) RIGHT (Right )  Patient location during evaluation: PACU Anesthesia Type: MAC Level of consciousness: awake Pain management: pain level controlled Vital Signs Assessment: post-procedure vital signs reviewed and stable Respiratory status: respiratory function stable Cardiovascular status: stable Postop Assessment: no signs of nausea or vomiting Anesthetic complications: no    Veda Canning

## 2016-12-09 NOTE — Op Note (Signed)
LOCATION:  [facility]   PREOPERATIVE DIAGNOSIS:    Nuclear sclerotic cataract right eye. H25.11   POSTOPERATIVE DIAGNOSIS:  Nuclear sclerotic cataract right eye.     PROCEDURE:  Phacoemusification with posterior chamber intraocular lens placement of the right eye   LENS:   Implant Name Type Inv. Item Serial No. Manufacturer Lot No. LRB No. Used  LENS IOL DIOP 23.0 - [id number] Intraocular Lens LENS IOL DIOP 23.0 [id number]   Right 1        ULTRASOUND TIME: 13 % of 1 minutes, 14 seconds.  CDE 9.4   SURGEON:  Wyonia Hough, MD   ANESTHESIA:  Topical with tetracaine drops and 2% Xylocaine jelly, augmented with 1% preservative-free intracameral lidocaine.    COMPLICATIONS:  None.   DESCRIPTION OF PROCEDURE:  The patient was identified in the holding room and transported to the operating room and placed in the supine position under the operating microscope.  The right eye was identified as the operative eye and it was prepped and draped in the usual sterile ophthalmic fashion.   A 1 millimeter clear-corneal paracentesis was made at the 12:00 position.  0.5 ml of preservative-free 1% lidocaine was injected into the anterior chamber. The anterior chamber was filled with Viscoat viscoelastic.  A 2.4 millimeter keratome was used to make a near-clear corneal incision at the 9:00 position.  A curvilinear capsulorrhexis was made with a cystotome and capsulorrhexis forceps.  Balanced salt solution was used to hydrodissect and hydrodelineate the nucleus.   Phacoemulsification was then used in stop and chop fashion to remove the lens nucleus and epinucleus.  The remaining cortex was then removed using the irrigation and aspiration handpiece. Provisc was then placed into the capsular bag to distend it for lens placement.  A lens was then injected into the capsular bag.  The remaining viscoelastic was aspirated.   Wounds were hydrated with balanced salt solution.  The anterior  chamber was inflated to a physiologic pressure with balanced salt solution.  No wound leaks were noted. Cefuroxime 0.1 ml of a 10mg /ml solution was injected into the anterior chamber for a dose of 1 mg of intracameral antibiotic at the completion of the case.   Timolol and Brimonidine drops were applied to the eye.  The patient was taken to the recovery room in stable condition without complications of anesthesia or surgery.   [name] 12/09/2016, 10:50 AM

## 2016-12-09 NOTE — H&P (Signed)
The History and Physical notes are on paper, have been signed, and are to be scanned. The patient remains stable and unchanged from the H&P.   Previous H&P reviewed, patient examined, and there are no changes.  [name] 12/09/2016 9:57 AM

## 2016-12-09 NOTE — Anesthesia Procedure Notes (Signed)
Procedure Name: MAC Performed by: Kennede Lusk Pre-anesthesia Checklist: Patient identified, Emergency Drugs available, Suction available, Timeout performed and Patient being monitored Patient Re-evaluated:Patient Re-evaluated prior to inductionOxygen Delivery Method: Nasal cannula Placement Confirmation: positive ETCO2     

## 2016-12-09 NOTE — Transfer of Care (Signed)
Immediate Anesthesia Transfer of Care Note  Patient: [name]  Procedure(s) Performed: CATARACT EXTRACTION PHACO AND INTRAOCULAR LENS PLACEMENT ([facility]) RIGHT (Right )  Patient Location: PACU  Anesthesia Type: MAC  Level of Consciousness: awake, alert  and patient cooperative  Airway and Oxygen Therapy: Patient Spontanous Breathing and Patient connected to supplemental oxygen  Post-op Assessment: Post-op Vital signs reviewed, Patient's Cardiovascular Status Stable, Respiratory Function Stable, Patent Airway and No signs of Nausea or vomiting  Post-op Vital Signs: Reviewed and stable  Complications: No apparent anesthesia complications

## 2016-12-09 NOTE — Anesthesia Preprocedure Evaluation (Signed)
Anesthesia Evaluation  Patient identified by MRN, date of birth, ID band Patient awake    Reviewed: Allergy & Precautions, NPO status , Patient's Chart, lab work & pertinent test results  History of Anesthesia Complications Negative for: history of anesthetic complications  Airway Mallampati: I  TM Distance: >3 FB     Dental no notable dental hx.    Pulmonary neg pulmonary ROS,    Pulmonary exam normal breath sounds clear to auscultation       Cardiovascular hypertension, + Peripheral Vascular Disease   Rhythm:Regular Rate:Normal + Systolic murmurs    Neuro/Psych Depression Wheelchair-bound  TIA Neuromuscular disease    GI/Hepatic Neg liver ROS, GERD  Controlled,  Endo/Other  negative endocrine ROS  Renal/GU negative Renal ROS     Musculoskeletal   Abdominal   Peds  Hematology  (+) anemia ,   Anesthesia Other Findings   Reproductive/Obstetrics                             Anesthesia Physical  Anesthesia Plan  ASA: III  Anesthesia Plan: MAC   Post-op Pain Management:    Induction: Intravenous  PONV Risk Score and Plan:   Airway Management Planned:   Additional Equipment:   Intra-op Plan:   Post-operative Plan:   Informed Consent: I have reviewed the patients History and Physical, chart, labs and discussed the procedure including the risks, benefits and alternatives for the proposed anesthesia with the patient or authorized representative who has indicated his/her understanding and acceptance.     Plan Discussed with: CRNA  Anesthesia Plan Comments:         Anesthesia Quick Evaluation

## 2016-12-10 ENCOUNTER — Encounter: Payer: Self-pay | Admitting: Ophthalmology

## 2016-12-17 ENCOUNTER — Encounter: Payer: Self-pay | Admitting: Emergency Medicine

## 2016-12-17 ENCOUNTER — Emergency Department: Payer: Medicare Other

## 2016-12-17 ENCOUNTER — Observation Stay
Admission: EM | Admit: 2016-12-17 | Discharge: 2016-12-18 | Disposition: A | Discharge status: Home / Self Care | Payer: Medicare Other | Attending: Internal Medicine | Admitting: Internal Medicine

## 2016-12-17 DIAGNOSIS — Z7902 Long term (current) use of antithrombotics/antiplatelets: Secondary | ICD-10-CM | POA: Insufficient documentation

## 2016-12-17 DIAGNOSIS — F0390 Unspecified dementia without behavioral disturbance: Secondary | ICD-10-CM | POA: Insufficient documentation

## 2016-12-17 DIAGNOSIS — J90 Pleural effusion, not elsewhere classified: Secondary | ICD-10-CM | POA: Diagnosis not present

## 2016-12-17 DIAGNOSIS — S0993XA Unspecified injury of face, initial encounter: Secondary | ICD-10-CM | POA: Diagnosis present

## 2016-12-17 DIAGNOSIS — K219 Gastro-esophageal reflux disease without esophagitis: Secondary | ICD-10-CM | POA: Insufficient documentation

## 2016-12-17 DIAGNOSIS — W050XXA Fall from non-moving wheelchair, initial encounter: Secondary | ICD-10-CM | POA: Diagnosis not present

## 2016-12-17 DIAGNOSIS — W19XXXA Unspecified fall, initial encounter: Secondary | ICD-10-CM

## 2016-12-17 DIAGNOSIS — I456 Pre-excitation syndrome: Secondary | ICD-10-CM | POA: Insufficient documentation

## 2016-12-17 DIAGNOSIS — Z8673 Personal history of transient ischemic attack (TIA), and cerebral infarction without residual deficits: Secondary | ICD-10-CM | POA: Diagnosis not present

## 2016-12-17 DIAGNOSIS — S0240CA Maxillary fracture, right side, initial encounter for closed fracture: Secondary | ICD-10-CM | POA: Diagnosis not present

## 2016-12-17 DIAGNOSIS — Y92129 Unspecified place in nursing home as the place of occurrence of the external cause: Secondary | ICD-10-CM | POA: Insufficient documentation

## 2016-12-17 DIAGNOSIS — S0231XA Fracture of orbital floor, right side, initial encounter for closed fracture: Secondary | ICD-10-CM | POA: Insufficient documentation

## 2016-12-17 DIAGNOSIS — F329 Major depressive disorder, single episode, unspecified: Secondary | ICD-10-CM | POA: Diagnosis not present

## 2016-12-17 DIAGNOSIS — S0292XA Unspecified fracture of facial bones, initial encounter for closed fracture: Secondary | ICD-10-CM

## 2016-12-17 DIAGNOSIS — K589 Irritable bowel syndrome without diarrhea: Secondary | ICD-10-CM | POA: Diagnosis not present

## 2016-12-17 DIAGNOSIS — Z7982 Long term (current) use of aspirin: Secondary | ICD-10-CM | POA: Insufficient documentation

## 2016-12-17 DIAGNOSIS — S0011XA Contusion of right eyelid and periocular area, initial encounter: Secondary | ICD-10-CM | POA: Diagnosis not present

## 2016-12-17 DIAGNOSIS — Z79899 Other long term (current) drug therapy: Secondary | ICD-10-CM | POA: Insufficient documentation

## 2016-12-17 DIAGNOSIS — I10 Essential (primary) hypertension: Secondary | ICD-10-CM | POA: Insufficient documentation

## 2016-12-17 DIAGNOSIS — N3281 Overactive bladder: Secondary | ICD-10-CM | POA: Diagnosis not present

## 2016-12-17 DIAGNOSIS — S0240EA Zygomatic fracture, right side, initial encounter for closed fracture: Secondary | ICD-10-CM | POA: Diagnosis not present

## 2016-12-17 DIAGNOSIS — Z66 Do not resuscitate: Secondary | ICD-10-CM | POA: Insufficient documentation

## 2016-12-17 DIAGNOSIS — E785 Hyperlipidemia, unspecified: Secondary | ICD-10-CM | POA: Insufficient documentation

## 2016-12-17 LAB — COMPREHENSIVE METABOLIC PANEL
ALBUMIN: 3.5 g/dL (ref 3.5–5.0)
ALT: 14 U/L (ref 14–54)
ANION GAP: 8 (ref 5–15)
AST: 20 U/L (ref 15–41)
Alkaline Phosphatase: 94 U/L (ref 38–126)
BUN: 19 mg/dL (ref 6–20)
CALCIUM: 8.7 mg/dL — AB (ref 8.9–10.3)
CO2: 26 mmol/L (ref 22–32)
Chloride: 100 mmol/L — ABNORMAL LOW (ref 101–111)
Creatinine, Ser: 0.54 mg/dL (ref 0.44–1.00)
GFR calc non Af Amer: >60 mL/min (ref >60)
Glucose, Bld: 127 mg/dL — ABNORMAL HIGH (ref 65–99)
POTASSIUM: 3.3 mmol/L — AB (ref 3.5–5.1)
SODIUM: 134 mmol/L — AB (ref 135–145)
Total Bilirubin: 0.4 mg/dL (ref 0.3–1.2)
Total Protein: 6.2 g/dL — ABNORMAL LOW (ref 6.5–8.1)

## 2016-12-17 LAB — CBC
HCT: 32.8 % — ABNORMAL LOW (ref 35.0–47.0)
Hemoglobin: 11.2 g/dL — ABNORMAL LOW (ref 12.0–16.0)
MCH: 30.4 pg (ref 26.0–34.0)
MCHC: 34 g/dL (ref 32.0–36.0)
MCV: 89.3 fL (ref 80.0–100.0)
PLATELETS: 228 10*3/uL (ref 150–440)
RBC: 3.67 MIL/uL — ABNORMAL LOW (ref 3.80–5.20)
RDW: 16.2 % — AB (ref 11.5–14.5)
WBC: 5.8 10*3/uL (ref 3.6–11.0)

## 2016-12-17 LAB — TROPONIN I: Troponin I: <0.03 ng/mL (ref <0.03)

## 2016-12-17 MED ORDER — FENTANYL CITRATE (PF) 100 MCG/2ML IJ SOLN
25.0000 ug | INTRAMUSCULAR | Status: AC | PRN
  Administered 2016-12-17 – 2016-12-18 (×2): 25 ug via INTRAVENOUS
  Filled 2016-12-17 (×2): qty 2

## 2016-12-17 MED ORDER — SODIUM CHLORIDE 0.9 % IV BOLUS (SEPSIS)
1000.0000 mL | Freq: Once | INTRAVENOUS | Status: AC
  Administered 2016-12-17: 1000 mL via INTRAVENOUS

## 2016-12-17 MED ORDER — LEVOFLOXACIN IN D5W 750 MG/150ML IV SOLN
750.0000 mg | Freq: Once | INTRAVENOUS | Status: AC
  Administered 2016-12-18: 750 mg via INTRAVENOUS
  Filled 2016-12-17: qty 150

## 2016-12-17 NOTE — ED Notes (Signed)
Pt went to radiology.

## 2016-12-17 NOTE — ED Notes (Signed)
Pt returned from radiology.

## 2016-12-17 NOTE — ED Triage Notes (Signed)
Pt presents to ED 14 from [facility] home via EMS c/o a fall from a wheel chair today; pt has a swollen right eye, a laceration on the top, right of the head, with bleeding that is controlled; pt also came in with a neck collar on. Pt is awake, alert, and oriented x4. Pt is c/o neck and back pain. Pt also has a left leg/foot that is more swollen than the right leg/foot.
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
## 2016-12-18 ENCOUNTER — Encounter: Payer: Self-pay | Admitting: Internal Medicine

## 2016-12-18 ENCOUNTER — Emergency Department: Payer: Medicare Other

## 2016-12-18 DIAGNOSIS — S0993XA Unspecified injury of face, initial encounter: Secondary | ICD-10-CM | POA: Diagnosis present

## 2016-12-18 DIAGNOSIS — S0240EA Zygomatic fracture, right side, initial encounter for closed fracture: Secondary | ICD-10-CM | POA: Diagnosis not present

## 2016-12-18 LAB — URINALYSIS, COMPLETE (UACMP) WITH MICROSCOPIC
BILIRUBIN URINE: NEGATIVE
Glucose, UA: NEGATIVE mg/dL
KETONES UR: NEGATIVE mg/dL
Leukocytes, UA: NEGATIVE
NITRITE: NEGATIVE
PH: 8 (ref 5.0–8.0)
PROTEIN: NEGATIVE mg/dL
SPECIFIC GRAVITY, URINE: 1.006 (ref 1.005–1.030)
Squamous Epithelial / LPF: NONE SEEN

## 2016-12-18 LAB — CBC
HCT: 31.3 % — ABNORMAL LOW (ref 35.0–47.0)
HEMOGLOBIN: 10.7 g/dL — AB (ref 12.0–16.0)
MCH: 30.8 pg (ref 26.0–34.0)
MCHC: 34.4 g/dL (ref 32.0–36.0)
MCV: 89.6 fL (ref 80.0–100.0)
PLATELETS: 199 10*3/uL (ref 150–440)
RBC: 3.49 MIL/uL — ABNORMAL LOW (ref 3.80–5.20)
RDW: 16.4 % — AB (ref 11.5–14.5)
WBC: 6.4 10*3/uL (ref 3.6–11.0)

## 2016-12-18 LAB — BASIC METABOLIC PANEL
ANION GAP: 6 (ref 5–15)
BUN: 16 mg/dL (ref 6–20)
CALCIUM: 8.5 mg/dL — AB (ref 8.9–10.3)
CO2: 27 mmol/L (ref 22–32)
Chloride: 104 mmol/L (ref 101–111)
Creatinine, Ser: 0.41 mg/dL — ABNORMAL LOW (ref 0.44–1.00)
GFR calc Af Amer: >60 mL/min (ref >60)
GFR calc non Af Amer: >60 mL/min (ref >60)
GLUCOSE: 115 mg/dL — AB (ref 65–99)
POTASSIUM: 3.4 mmol/L — AB (ref 3.5–5.1)
Sodium: 137 mmol/L (ref 135–145)

## 2016-12-18 LAB — MRSA PCR SCREENING: MRSA BY PCR: NEGATIVE

## 2016-12-18 MED ORDER — ATORVASTATIN CALCIUM 20 MG PO TABS: 40.0000 mg | ORAL_TABLET | Freq: Every day | ORAL | Status: DC

## 2016-12-18 MED ORDER — ONDANSETRON HCL 4 MG/2ML IJ SOLN: 4.0000 mg | Freq: Four times a day (QID) | INTRAMUSCULAR | Status: DC | PRN

## 2016-12-18 MED ORDER — SENNA 8.6 MG PO TABS
1.0000 | ORAL_TABLET | Freq: Two times a day (BID) | ORAL | Status: DC
  Administered 2016-12-18: 8.6 mg via ORAL
  Filled 2016-12-18: qty 1

## 2016-12-18 MED ORDER — HYDROMORPHONE HCL 1 MG/ML IJ SOLN: 1.0000 mg | Freq: Once | INTRAMUSCULAR | Status: DC

## 2016-12-18 MED ORDER — SERTRALINE HCL 50 MG PO TABS
50.0000 mg | ORAL_TABLET | Freq: Every day | ORAL | Status: DC
  Administered 2016-12-18: 50 mg via ORAL
  Filled 2016-12-18: qty 1

## 2016-12-18 MED ORDER — PANTOPRAZOLE SODIUM 40 MG PO TBEC
40.0000 mg | DELAYED_RELEASE_TABLET | Freq: Every day | ORAL | Status: DC
  Administered 2016-12-18: 40 mg via ORAL
  Filled 2016-12-18: qty 1

## 2016-12-18 MED ORDER — HYDRALAZINE HCL 20 MG/ML IJ SOLN: 10.0000 mg | INTRAMUSCULAR | Status: DC | PRN

## 2016-12-18 MED ORDER — POLYETHYLENE GLYCOL 3350 17 G PO PACK: 17.0000 g | PACK | Freq: Every day | ORAL | Status: DC | PRN

## 2016-12-18 MED ORDER — BENAZEPRIL HCL 40 MG PO TABS
40.0000 mg | ORAL_TABLET | Freq: Every day | ORAL | Status: DC
  Administered 2016-12-18: 40 mg via ORAL
  Filled 2016-12-18: qty 1

## 2016-12-18 MED ORDER — ONDANSETRON HCL 4 MG PO TABS: 4.0000 mg | ORAL_TABLET | Freq: Four times a day (QID) | ORAL | Status: DC | PRN

## 2016-12-18 MED ORDER — METOPROLOL SUCCINATE ER 50 MG PO TB24
50.0000 mg | ORAL_TABLET | Freq: Every day | ORAL | Status: DC
Start: 2016-12-18 — End: 2016-12-18
  Administered 2016-12-18: 50 mg via ORAL
  Filled 2016-12-18: qty 1

## 2016-12-18 MED ORDER — POTASSIUM CHLORIDE CRYS ER 20 MEQ PO TBCR: 20.0000 meq | EXTENDED_RELEASE_TABLET | Freq: Once | ORAL | Status: DC

## 2016-12-18 MED ORDER — ACETAMINOPHEN 650 MG RE SUPP: 650.0000 mg | Freq: Four times a day (QID) | RECTAL | Status: DC | PRN

## 2016-12-18 MED ORDER — ACETAMINOPHEN 325 MG PO TABS: 650.0000 mg | ORAL_TABLET | Freq: Four times a day (QID) | ORAL | Status: DC | PRN

## 2016-12-18 MED ORDER — AMLODIPINE BESYLATE 5 MG PO TABS
5.0000 mg | ORAL_TABLET | Freq: Every day | ORAL | Status: DC
  Administered 2016-12-18: 5 mg via ORAL
  Filled 2016-12-18: qty 1

## 2016-12-18 MED ORDER — HYDROCODONE-ACETAMINOPHEN 5-325 MG PO TABS
1.0000 | ORAL_TABLET | ORAL | Status: DC | PRN
Start: 2016-12-18 — End: 2016-12-18

## 2016-12-18 MED ORDER — AMOXICILLIN-POT CLAVULANATE 875-125 MG PO TABS: 1.0000 | ORAL_TABLET | Freq: Two times a day (BID) | ORAL | 0 refills | Status: AC

## 2016-12-18 NOTE — ED Notes (Signed)
Pt is getting Ultrasound done in the room; ultrasound tech in the room.

## 2016-12-18 NOTE — NC FL2 (Signed)
[place] LEVEL OF CARE SCREENING TOOL     IDENTIFICATION  Patient Name: [name] Birthdate: 22-Sep-1923 Sex: female Admission Date (Current Location): 12/17/2016  [place] and [place] Number:  Engineering geologist and Address:  [facility], [address], [place], [place] [address]      Provider Number: [id number]  Attending Physician Name and Address:  [name], *  Relative Name and Phone Number:       Current Level of Care: Hospital Recommended Level of Care: [place] Prior Approval Number:    Date Approved/Denied:   PASRR Number:  ([id number] )  Discharge Plan: SNF    Current Diagnoses: Patient Active Problem List   Diagnosis Date Noted  . Facial trauma 12/18/2016  . Closed fracture of right distal femur ([facility]) 01/31/2016  . HLD (hyperlipidemia) 01/31/2016  . Normocytic normochromic anemia 01/31/2016  . History of stroke 01/31/2016  . Hip fracture ([place]) 01/31/2016  . Peri-prosthetic supracondylar fracture of femur 01/31/2016  . CVA (cerebral infarction) 11/06/2015  . Essential hypertension 11/06/2015  . Hyponatremia 11/06/2015  . Hyperglycemia 11/06/2015    Orientation RESPIRATION BLADDER Height & Weight     Self, Time, Situation, Place  Normal Continent Weight: 111 lb 8 oz (50.6 kg) Height:  4\' 9"  (144.8 cm)  BEHAVIORAL SYMPTOMS/MOOD NEUROLOGICAL BOWEL NUTRITION STATUS      Continent Diet (Diet: Heart Healthy )  AMBULATORY STATUS COMMUNICATION OF NEEDS Skin   Extensive Assist Verbally Bruising                       Personal Care Assistance Level of Assistance  Bathing, Feeding, Dressing Bathing Assistance: Limited assistance Feeding assistance: Independent Dressing Assistance: Limited assistance     Functional Limitations Info  Sight, Hearing, Speech Sight Info: Impaired (Impared due to bruising on right eye. ) Hearing Info: Adequate Speech Info: Adequate    SPECIAL  CARE FACTORS FREQUENCY                       Contractures      Additional Factors Info  Code Status, Allergies Code Status Info:  (Full Code. ) Allergies Info:  (No Known Allergies. )           Current Medications (12/18/2016):  This is the current hospital active medication list Current Facility-Administered Medications  Medication Dose Route Frequency Provider Last Rate Last Dose  . acetaminophen (TYLENOL) tablet 650 mg  650 mg Oral Q6H PRN Saundra Shelling, MD       Or  . acetaminophen (TYLENOL) suppository 650 mg  650 mg Rectal Q6H PRN Pyreddy, Reatha Harps, MD      . amLODipine (NORVASC) tablet 5 mg  5 mg Oral Daily Pyreddy, Pavan, MD      . atorvastatin (LIPITOR) tablet 40 mg  40 mg Oral q1800 Pyreddy, Reatha Harps, MD      . benazepril (LOTENSIN) tablet 40 mg  40 mg Oral Daily Pyreddy, Pavan, MD      . hydrALAZINE (APRESOLINE) injection 10 mg  10 mg Intravenous Q4H PRN Pyreddy, Reatha Harps, MD      . HYDROcodone-acetaminophen (NORCO/VICODIN) 5-325 MG per tablet 1-2 tablet  1-2 tablet Oral Q4H PRN Pyreddy, Reatha Harps, MD      . metoprolol succinate (TOPROL-XL) 24 hr tablet 50 mg  50 mg Oral Daily Pyreddy, Pavan, MD      . ondansetron (ZOFRAN) tablet 4 mg  4 mg Oral Q6H PRN Pyreddy, Pavan,  MD       Or  . ondansetron (ZOFRAN) injection 4 mg  4 mg Intravenous Q6H PRN Pyreddy, Pavan, MD      . pantoprazole (PROTONIX) EC tablet 40 mg  40 mg Oral Daily Pyreddy, Pavan, MD      . polyethylene glycol (MIRALAX / GLYCOLAX) packet 17 g  17 g Oral Daily PRN Pyreddy, Pavan, MD      . potassium chloride SA (K-DUR,KLOR-CON) CR tablet 20 mEq  20 mEq Oral Once Saundra Shelling, MD      . senna (SENOKOT) tablet 8.6 mg  1 tablet Oral BID Saundra Shelling, MD   Stopped at 12/18/16 0315  . sertraline (ZOLOFT) tablet 50 mg  50 mg Oral Daily Pyreddy, Reatha Harps, MD         Discharge Medications: Please see discharge summary for a list of discharge medications.  Relevant Imaging Results:  Relevant Lab  Results:   Additional Information  (SSN: [id number])  Sample, Veronia Beets, LCSW

## 2016-12-18 NOTE — Consult Note (Signed)
Subjective: [place] out of wheelchair last night. Struck right face.Minimal discomfort of face. No pain or diplopia. Eye is red. Had CE/IOL one week ago.   Objective: Vital signs in last 24 hours: Temp:  [97.8 F (36.6 C)-98.5 F (36.9 C)] 98.5 F (36.9 C) (10/31 0836) Pulse Rate:  [72-92] 92 (10/31 1141) Resp:  [12-17] 16 (10/31 0836) BP: (151-205)/(63-90) 151/67 (10/31 0836) SpO2:  [95 %-98 %] 97 % (10/31 0836) Weight:  [50.6 kg (111 lb 8 oz)-52.2 kg (115 lb)] 50.6 kg (111 lb 8 oz) (10/31 0320)    Exam. OD  UCVA near J6 ( 20/70)   IOP 14 OD , 07 OS             Pupils miotic OU. No APD.  Motility normal OU with out pain or diplopia. Moderate upper / mild lower lid ecchymoses OD.  Moderate Hurricane OD  AC is quiet. IOL in place.    Recent Labs  12/17/16 2040 12/18/16 0430  WBC 5.8 6.4  HGB 11.2* 10.7*  HCT 32.8* 31.3*  NA 134* 137  K 3.3* 3.4*  CL 100* 104  CO2 26 27  BUN 19 16  CREATININE 0.54 0.41*    Studies/Results: Dg Chest 1 View  Result Date: 12/17/2016 CLINICAL DATA:  Right shoulder, neck and back pain post fall. EXAM: CHEST 1 VIEW COMPARISON:  02/04/2016 FINDINGS: Enlarged cardiac silhouette.  Mediastinal contours appear intact. There is no evidence of pneumothorax. Opacification of the left retrocardiac space may represent pleural effusions/airspace consolidation or atelectasis. There is a small right pleural effusion. Osseous structures are without acute abnormality. Soft tissues are grossly normal. IMPRESSION: Probable bilateral pleural effusions. Left lung base airspace disease versus atelectasis not excluded. Electronically Signed   By: Fidela Salisbury M.D.   On: 12/17/2016 21:31   Dg Thoracic Spine 2 View  Result Date: 12/17/2016 CLINICAL DATA:  Fall.  Neck and back pain. EXAM: THORACIC SPINE 2 VIEWS COMPARISON:  Thoracic spine MRI 07/11/2011. FINDINGS: AP and lateral views. The AP view demonstrates S-shaped thoracic spine curvature. The lateral view is  suboptimal secondary to patient positioning, with the arms not raised above the head. Compression deformity at T12 is severe, grossly similar to 2013. Remainder of vertebral body height is grossly maintained through the top of T2; the cervical spine CT includes through the bottom of T3. Expected for age spondylosis. L1 level not well evaluated on the lateral view. IMPRESSION: Moderate degradation, as detailed above. Severe T12 compression deformity, grossly similar to 07/11/2011 MRI. No convincing evidence of acute superimposed injury. Electronically Signed   By: Abigail Miyamoto M.D.   On: 12/17/2016 21:33   Dg Shoulder Right  Result Date: 12/17/2016 CLINICAL DATA:  Fall.  Pain. EXAM: RIGHT SHOULDER - 2+ VIEW COMPARISON:  None. FINDINGS: There is no evidence of fracture or dislocation. There is no evidence of arthropathy or other focal bone abnormality. Soft tissues are unremarkable. Osteopenia. IMPRESSION: Negative. Electronically Signed   By: Staci Righter M.D.   On: 12/17/2016 21:30   Ct Head Wo Contrast  Result Date: 12/17/2016 CLINICAL DATA:  [place] from a wheelchair today. Face swelling. Neck pain. Laceration to the head. EXAM: CT HEAD WITHOUT CONTRAST CT MAXILLOFACIAL WITHOUT CONTRAST CT CERVICAL SPINE WITHOUT CONTRAST TECHNIQUE: Multidetector CT imaging of the head, cervical spine, and maxillofacial structures were performed using the standard protocol without intravenous contrast. Multiplanar CT image reconstructions of the cervical spine and maxillofacial structures were also generated. COMPARISON:  CT head 01/30/2016. FINDINGS: CT  HEAD FINDINGS Brain: No evidence for acute infarction, hemorrhage, mass lesion, hydrocephalus, or extra-axial fluid. Generalized atrophy. Hypoattenuation of white matter representing small vessel disease. Vascular: Calcification of the cavernous internal carotid arteries consistent with cerebrovascular atherosclerotic disease. No signs of intracranial large vessel  occlusion. Skull: Normal. Negative for fracture or focal lesion. Other: None. CT MAXILLOFACIAL FINDINGS Osseous: RIGHT lateral orbital wall fracture, inward buckling. RIGHT zygoma fracture, midportion, slight inward displacement. RIGHT anterior maxillary wall fracture, at the zygomaticomaxillary suture. Minimal nondisplaced orbital floor fracture. Mandible intact. Minimal posterolateral RIGHT maxillary sinus fracture. No definite pterygoid fracture. Orbits: The marked preseptal periorbital soft tissue swelling on the RIGHT. BILATERAL cataract extraction. Orbital floor fracture contributes to minimal orbital emphysema, but no retro bulb are or postseptal hemorrhage. No muscle entrapment. Sinuses: Layering hemorrhage in the RIGHT maxillary sinus reflects adjacent fractures. There is stranding of the retro antral. Soft tissues: RIGHT facial soft tissue swelling. CT CERVICAL SPINE FINDINGS Alignment: Reversal of the normal cervical lordotic curvature. Degenerative anterolisthesis at C2-3 of 2 mm, C3-4 3 mm. No traumatic subluxation. Skull base and vertebrae: No acute fracture. No primary bone lesion or focal pathologic process. Soft tissues and spinal canal: No prevertebral fluid or swelling. No visible canal hematoma. Atherosclerosis. Distended jugular veins, query RIGHT heart strain. Disc levels: Severe disc space narrowing at multiple levels from C3-4 through C7-T1. Osseous spurring and calcific disc material contribute to stenosis, worst at C5-C6. Upper chest: Great vessel atherosclerosis. No pneumothorax. Large LEFT and moderate RIGHT pleural effusiona. Other: None. IMPRESSION: Atrophy and small vessel disease. No skull fracture or intracranial hemorrhage. Advanced cervical spondylosis. No cervical spine fracture or traumatic subluxation. Large LEFT and moderate RIGHT pleural effusions. Multiple nondisplaced or minimally displaced RIGHT facial fractures, including zygoma, lateral orbital wall and floor, as well as  the anterior and lateral maxillary sinus walls. RIGHT maxillary hemosinus. Minimal orbital emphysema without entrapment of the orbital musculature. RIGHT facial soft tissue swelling and preseptal periorbital hematoma, but no significant postseptal/retrobulbar hemorrhage in the RIGHT orbit. Electronically Signed   By: Staci Righter M.D.   On: 12/17/2016 21:29   Ct Cervical Spine Wo Contrast  Result Date: 12/17/2016 CLINICAL DATA:  [place] from a wheelchair today. Face swelling. Neck pain. Laceration to the head. EXAM: CT HEAD WITHOUT CONTRAST CT MAXILLOFACIAL WITHOUT CONTRAST CT CERVICAL SPINE WITHOUT CONTRAST TECHNIQUE: Multidetector CT imaging of the head, cervical spine, and maxillofacial structures were performed using the standard protocol without intravenous contrast. Multiplanar CT image reconstructions of the cervical spine and maxillofacial structures were also generated. COMPARISON:  CT head 01/30/2016. FINDINGS: CT HEAD FINDINGS Brain: No evidence for acute infarction, hemorrhage, mass lesion, hydrocephalus, or extra-axial fluid. Generalized atrophy. Hypoattenuation of white matter representing small vessel disease. Vascular: Calcification of the cavernous internal carotid arteries consistent with cerebrovascular atherosclerotic disease. No signs of intracranial large vessel occlusion. Skull: Normal. Negative for fracture or focal lesion. Other: None. CT MAXILLOFACIAL FINDINGS Osseous: RIGHT lateral orbital wall fracture, inward buckling. RIGHT zygoma fracture, midportion, slight inward displacement. RIGHT anterior maxillary wall fracture, at the zygomaticomaxillary suture. Minimal nondisplaced orbital floor fracture. Mandible intact. Minimal posterolateral RIGHT maxillary sinus fracture. No definite pterygoid fracture. Orbits: The marked preseptal periorbital soft tissue swelling on the RIGHT. BILATERAL cataract extraction. Orbital floor fracture contributes to minimal orbital emphysema, but no retro  bulb are or postseptal hemorrhage. No muscle entrapment. Sinuses: Layering hemorrhage in the RIGHT maxillary sinus reflects adjacent fractures. There is stranding of the retro antral. Soft tissues: RIGHT facial soft tissue  swelling. CT CERVICAL SPINE FINDINGS Alignment: Reversal of the normal cervical lordotic curvature. Degenerative anterolisthesis at C2-3 of 2 mm, C3-4 3 mm. No traumatic subluxation. Skull base and vertebrae: No acute fracture. No primary bone lesion or focal pathologic process. Soft tissues and spinal canal: No prevertebral fluid or swelling. No visible canal hematoma. Atherosclerosis. Distended jugular veins, query RIGHT heart strain. Disc levels: Severe disc space narrowing at multiple levels from C3-4 through C7-T1. Osseous spurring and calcific disc material contribute to stenosis, worst at C5-C6. Upper chest: Great vessel atherosclerosis. No pneumothorax. Large LEFT and moderate RIGHT pleural effusiona. Other: None. IMPRESSION: Atrophy and small vessel disease. No skull fracture or intracranial hemorrhage. Advanced cervical spondylosis. No cervical spine fracture or traumatic subluxation. Large LEFT and moderate RIGHT pleural effusions. Multiple nondisplaced or minimally displaced RIGHT facial fractures, including zygoma, lateral orbital wall and floor, as well as the anterior and lateral maxillary sinus walls. RIGHT maxillary hemosinus. Minimal orbital emphysema without entrapment of the orbital musculature. RIGHT facial soft tissue swelling and preseptal periorbital hematoma, but no significant postseptal/retrobulbar hemorrhage in the RIGHT orbit. Electronically Signed   By: Staci Righter M.D.   On: 12/17/2016 21:29   US Venous Img Lower Unilateral Left  Result Date: 12/18/2016 CLINICAL DATA:  83 year old female with left leg swelling. EXAM: Left LOWER EXTREMITY VENOUS DOPPLER ULTRASOUND TECHNIQUE: Gray-scale sonography with graded compression, as well as color Doppler and duplex  ultrasound were performed to evaluate the lower extremity deep venous systems from the level of the common femoral vein and including the common femoral, femoral, profunda femoral, popliteal and calf veins including the posterior tibial, peroneal and gastrocnemius veins when visible. The superficial great saphenous vein was also interrogated. Spectral Doppler was utilized to evaluate flow at rest and with distal augmentation maneuvers in the common femoral, femoral and popliteal veins. COMPARISON:  Ultrasound dated 11/06/2015 FINDINGS: Contralateral Common Femoral Vein: Respiratory phasicity is normal and symmetric with the symptomatic side. No evidence of thrombus. Normal compressibility. Common Femoral Vein: No evidence of thrombus. Normal compressibility, respiratory phasicity and response to augmentation. Saphenofemoral Junction: No evidence of thrombus. Normal compressibility and flow on color Doppler imaging. Profunda Femoral Vein: No evidence of thrombus. Normal compressibility and flow on color Doppler imaging. Femoral Vein: No evidence of thrombus. Normal compressibility, respiratory phasicity and response to augmentation. Popliteal Vein: No evidence of thrombus. Normal compressibility, respiratory phasicity and response to augmentation. Calf Veins: No evidence of thrombus. Normal compressibility and flow on color Doppler imaging. Superficial Great Saphenous Vein: No evidence of thrombus. Normal compressibility. Venous Reflux:  None. Other Findings: A 3.6 x 0.7 x 2.3 cm fluid collection in the left popliteal fossa noted which may represent a Baker's cyst. IMPRESSION: No evidence of deep venous thrombosis in the left lower extremity. Small fluid collection in the left popliteal fossa, possibly a Baker's cyst. Electronically Signed   By: Anner Crete M.D.   On: 12/18/2016 01:54   Ct Maxillofacial Wo Contrast  Result Date: 12/17/2016 CLINICAL DATA:  [place] from a wheelchair today. Face swelling. Neck  pain. Laceration to the head. EXAM: CT HEAD WITHOUT CONTRAST CT MAXILLOFACIAL WITHOUT CONTRAST CT CERVICAL SPINE WITHOUT CONTRAST TECHNIQUE: Multidetector CT imaging of the head, cervical spine, and maxillofacial structures were performed using the standard protocol without intravenous contrast. Multiplanar CT image reconstructions of the cervical spine and maxillofacial structures were also generated. COMPARISON:  CT head 01/30/2016. FINDINGS: CT HEAD FINDINGS Brain: No evidence for acute infarction, hemorrhage, mass lesion, hydrocephalus, or extra-axial fluid.  Generalized atrophy. Hypoattenuation of white matter representing small vessel disease. Vascular: Calcification of the cavernous internal carotid arteries consistent with cerebrovascular atherosclerotic disease. No signs of intracranial large vessel occlusion. Skull: Normal. Negative for fracture or focal lesion. Other: None. CT MAXILLOFACIAL FINDINGS Osseous: RIGHT lateral orbital wall fracture, inward buckling. RIGHT zygoma fracture, midportion, slight inward displacement. RIGHT anterior maxillary wall fracture, at the zygomaticomaxillary suture. Minimal nondisplaced orbital floor fracture. Mandible intact. Minimal posterolateral RIGHT maxillary sinus fracture. No definite pterygoid fracture. Orbits: The marked preseptal periorbital soft tissue swelling on the RIGHT. BILATERAL cataract extraction. Orbital floor fracture contributes to minimal orbital emphysema, but no retro bulb are or postseptal hemorrhage. No muscle entrapment. Sinuses: Layering hemorrhage in the RIGHT maxillary sinus reflects adjacent fractures. There is stranding of the retro antral. Soft tissues: RIGHT facial soft tissue swelling. CT CERVICAL SPINE FINDINGS Alignment: Reversal of the normal cervical lordotic curvature. Degenerative anterolisthesis at C2-3 of 2 mm, C3-4 3 mm. No traumatic subluxation. Skull base and vertebrae: No acute fracture. No primary bone lesion or focal  pathologic process. Soft tissues and spinal canal: No prevertebral fluid or swelling. No visible canal hematoma. Atherosclerosis. Distended jugular veins, query RIGHT heart strain. Disc levels: Severe disc space narrowing at multiple levels from C3-4 through C7-T1. Osseous spurring and calcific disc material contribute to stenosis, worst at C5-C6. Upper chest: Great vessel atherosclerosis. No pneumothorax. Large LEFT and moderate RIGHT pleural effusiona. Other: None. IMPRESSION: Atrophy and small vessel disease. No skull fracture or intracranial hemorrhage. Advanced cervical spondylosis. No cervical spine fracture or traumatic subluxation. Large LEFT and moderate RIGHT pleural effusions. Multiple nondisplaced or minimally displaced RIGHT facial fractures, including zygoma, lateral orbital wall and floor, as well as the anterior and lateral maxillary sinus walls. RIGHT maxillary hemosinus. Minimal orbital emphysema without entrapment of the orbital musculature. RIGHT facial soft tissue swelling and preseptal periorbital hematoma, but no significant postseptal/retrobulbar hemorrhage in the RIGHT orbit. Electronically Signed   By: Staci Righter M.D.   On: 12/17/2016 21:29     Assessment/Plan: 1) Right bony facial trauma. Lateral Maxillary and small floor fractures. Subsequent soft tissue injury, ecchymoses and [place]. No indication for orbital surgery or cantholysis. 2) s/p CE/IOL OD, recent Continue post operative drops as prescribed by Dr. Wallace Going.  Follow up as scheduled or if pain or decreased vision.  LOS: 0 days   Torre Pikus L[place] 10/31/20181:10 PM

## 2016-12-18 NOTE — H&P (Signed)
[facility] NAME: [name]    MR#:  [id number]  DATE OF BIRTH:  24-May-1923  DATE OF ADMISSION:  12/17/2016  PRIMARY CARE PHYSICIAN: Lynnell Jude, MD   REQUESTING/REFERRING PHYSICIAN:   CHIEF COMPLAINT:   Chief Complaint  Patient presents with  . Fall    HISTORY OF PRESENT ILLNESS: [name]  is a 83 y.o. female with a known history of dysarthria, hyperlipidemia, hypertension, arthritis, hyperlipidemia, GERD Wolff-Parkinson-White syndrome presented to the emergency room after she had a fall in the nursing home. Patient was sitting in the wheelchair fell forward and fell onto the ground onto her face. She has ecchymosis and bruising around the right eye and upper part of the face. Patient was worked up with multiple CT scans in the emergency room. She had CT maxillofacial area, CT head and CT spine. CT scans revealed multiple facial fractures on the right side along with a right orbital emphysema and preseptal periorbital hematoma. Case was discussed by ER physician with Dr. Tami Ribas who recommended no surgical intervention at this time. Patient also has pleural effusions on the chest x-ray. She also has swelling of the left proximally for which she was worked up with Doppler venous ultrasound which showed no DVT. Hospitalist service was further consulted. Patient was on aspirin and Plavix at the nursing home.  PAST MEDICAL HISTORY:   Past Medical History:  Diagnosis Date  . Anemia   . Arthritis    OSTEO  . Cancer (HCC)    BREAST  . Cerebrovascular spasm   . Cognitive communication deficit   . Constipation   . Depression   . Dysarthria and anarthria   . GERD (gastroesophageal reflux disease)   . Hearing loss   . Hyperlipidemia   . Hypertension   . Hypo-osmolality and hyponatremia   . IBS (irritable bowel syndrome)   . Osteoporosis   . Overactive bladder   . Pneumonia 08/16/2016   on antibiotic  . Pre-excitation  syndrome   . Sciatica   . Spinal stenosis   . Swallowing difficulty   . TIA (transient ischemic attack)   . TIA (transient ischemic attack)    SYNCOPE  . Vitamin deficiency   . WPW (Wolff-Parkinson-White syndrome)     PAST SURGICAL HISTORY: Past Surgical History:  Procedure Laterality Date  . ABDOMINAL HYSTERECTOMY    . CATARACT EXTRACTION W/PHACO Left 08/28/2016   Procedure: CATARACT EXTRACTION PHACO AND INTRAOCULAR LENS PLACEMENT ([place])  Left;  Surgeon: Leandrew Koyanagi, MD;  Location: [facility];  Service: Ophthalmology;  Laterality: Left;  Patient needs wheelchair  . CATARACT EXTRACTION W/PHACO Right 12/09/2016   Procedure: CATARACT EXTRACTION PHACO AND INTRAOCULAR LENS PLACEMENT ([place]) RIGHT;  Surgeon: Leandrew Koyanagi, MD;  Location: [facility];  Service: Ophthalmology;  Laterality: Right;  . MASTECTOMY Left   . ORIF FEMUR FRACTURE Right 01/31/2016   Procedure: OPEN REDUCTION INTERNAL FIXATION DISTAL FEMUR FRACTURE;  Surgeon: Rod Can, MD;  Location: [facility];  Service: Orthopedics;  Laterality: Right;  . REPLACEMENT TOTAL KNEE Right     SOCIAL HISTORY:  Social History  Substance Use Topics  . Smoking status: Never Smoker  . Smokeless tobacco: Never Used  . Alcohol use No    FAMILY HISTORY:  Family History  Problem Relation Age of Onset  . Lymphoma Brother   . CAD Brother   . Prostate cancer Brother   . Cancer Mother   . Cancer Father  DRUG ALLERGIES: No Known Allergies  REVIEW OF SYSTEMS:  Could not be obtained completely as patient has dysarthria. MEDICATIONS AT HOME:  Prior to Admission medications   Medication Sig Start Date End Date Taking? Authorizing Provider  acetaminophen (TYLENOL) 500 MG tablet Take 1,000 mg by mouth 2 (two) times daily as needed for moderate pain.    Yes [provider]  amLODipine (NORVASC) 5 MG tablet Take 5 mg by mouth daily. AM   Yes [provider]  ASPIRIN 81 PO Take 81 mg by  mouth 2 (two) times daily.    Yes [provider]  atorvastatin (LIPITOR) 40 MG tablet Take 1 tablet (40 mg total) by mouth daily at 6 PM. 11/07/15  Yes Wieting, Richard, MD  benazepril (LOTENSIN) 40 MG tablet Take 40 mg by mouth daily. AM   Yes [provider]  clopidogrel (PLAVIX) 75 MG tablet Take 1 tablet (75 mg total) by mouth daily. Patient taking differently: Take 75 mg by mouth daily. AM 11/08/15  Yes Loletha Grayer, MD  HYDROcodone-acetaminophen (NORCO/VICODIN) 5-325 MG tablet Take 1-2 tablets by mouth every 6 (six) hours as needed for moderate pain. 02/05/16  Yes Nita Sells, MD  ibuprofen (ADVIL,MOTRIN) 200 MG tablet Take 400 mg by mouth every 6 (six) hours as needed for moderate pain.    Yes [provider]  metoprolol succinate (TOPROL-XL) 50 MG 24 hr tablet Take 50 mg by mouth daily. Take with or immediately following a meal. / BREAKFAST   Yes [provider]  pantoprazole (PROTONIX) 40 MG tablet Take 40 mg by mouth daily.   Yes [provider]  polyethylene glycol (MIRALAX / GLYCOLAX) packet Take 17 g by mouth daily as needed for mild constipation. 02/05/16  Yes Nita Sells, MD  senna (SENOKOT) 8.6 MG TABS tablet Take 1 tablet (8.6 mg total) by mouth 2 (two) times daily. 02/05/16  Yes Nita Sells, MD  sertraline (ZOLOFT) 50 MG tablet Take 50 mg by mouth daily. AM   Yes [provider]  enoxaparin (LOVENOX) 40 MG/0.4ML injection Inject 0.4 mLs (40 mg total) into the skin daily. Patient not taking: Reported on 12/17/2016 02/06/16   Nita Sells, MD      PHYSICAL EXAMINATION:   VITAL SIGNS: Blood pressure (!) 198/69, pulse 72, temperature 98.4 F (36.9 C), temperature source Oral, resp. rate 12, height 5\' 2"  (1.575 m), weight 52.2 kg (115 lb), SpO2 95 %.  GENERAL:  83 y.o.-year-old patient lying in the bed awake and verbally responsive EYES: ecchymosis around right eye and orbit  Pupils equal,  round, reactive to light and accommodation. No scleral icterus.  HEENT: Head normocephalic.  Ecchymosis around right eye Oropharynx and nasopharynx clear.  NECK:  Supple, no jugular venous distention. No thyroid enlargement, no tenderness.  LUNGS: Normal breath sounds bilaterally, no wheezing, rales,rhonchi or crepitation. No use of accessory muscles of respiration.  CARDIOVASCULAR: S1, S2 normal. No murmurs, rubs, or gallops.  ABDOMEN: Soft, nontender, nondistended. Bowel sounds present. No organomegaly or mass.  EXTREMITIES : left lower extremity mild swelling Pedal pulses present NEUROLOGIC: Cranial nerves II through XII are intact. Muscle strength 5/5 in all extremities. Sensation intact. Gait not checked.  PSYCHIATRIC: The patient is alert and oriented x 3.  SKIN: ecchymosis around right eye and right fore head  LABORATORY PANEL:   CBC  Recent Labs Lab 12/17/16 2040  WBC 5.8  HGB 11.2*  HCT 32.8*  PLT 228  MCV 89.3  MCH 30.4  MCHC 34.0  RDW 16.2*   ------------------------------------------------------------------------------------------------------------------  Chemistries   Recent Labs Lab 12/17/16 2040  NA 134*  K 3.3*  CL 100*  CO2 26  GLUCOSE 127*  BUN 19  CREATININE 0.54  CALCIUM 8.7*  AST 20  ALT 14  ALKPHOS 94  BILITOT 0.4   ------------------------------------------------------------------------------------------------------------------ estimated creatinine clearance is 37 mL/min (by C-G formula based on SCr of 0.54 mg/dL). ------------------------------------------------------------------------------------------------------------------ No results for input(s): TSH, T4TOTAL, T3FREE, THYROIDAB in the last 72 hours.  Invalid input(s): FREET3   Coagulation profile No results for input(s): INR, PROTIME in the last 168 hours. ------------------------------------------------------------------------------------------------------------------- No  results for input(s): DDIMER in the last 72 hours. -------------------------------------------------------------------------------------------------------------------  Cardiac Enzymes  Recent Labs Lab 12/17/16 2040  TROPONINI <0.03   ------------------------------------------------------------------------------------------------------------------ Invalid input(s): POCBNP  ---------------------------------------------------------------------------------------------------------------  Urinalysis    Component Value Date/Time   COLORURINE STRAW (A) 06/12/2016 1234   APPEARANCEUR CLEAR (A) 06/12/2016 1234   APPEARANCEUR Clear 07/11/2011 0011   LABSPEC 1.004 (L) 06/12/2016 1234   LABSPEC 1.006 07/11/2011 0011   PHURINE 7.0 06/12/2016 1234   GLUCOSEU NEGATIVE 06/12/2016 1234   GLUCOSEU Negative 07/11/2011 0011   HGBUR SMALL (A) 06/12/2016 1234   BILIRUBINUR NEGATIVE 06/12/2016 1234   BILIRUBINUR Negative 07/11/2011 0011   KETONESUR NEGATIVE 06/12/2016 1234   PROTEINUR NEGATIVE 06/12/2016 1234   NITRITE NEGATIVE 06/12/2016 1234   LEUKOCYTESUR NEGATIVE 06/12/2016 1234   LEUKOCYTESUR 3+ 07/11/2011 0011     RADIOLOGY: Dg Chest 1 View  Result Date: 12/17/2016 CLINICAL DATA:  Right shoulder, neck and back pain post fall. EXAM: CHEST 1 VIEW COMPARISON:  02/04/2016 FINDINGS: Enlarged cardiac silhouette.  Mediastinal contours appear intact. There is no evidence of pneumothorax. Opacification of the left retrocardiac space may represent pleural effusions/airspace consolidation or atelectasis. There is a small right pleural effusion. Osseous structures are without acute abnormality. Soft tissues are grossly normal. IMPRESSION: Probable bilateral pleural effusions. Left lung base airspace disease versus atelectasis not excluded. Electronically Signed   By: Fidela Salisbury M.D.   On: 12/17/2016 21:31   Dg Thoracic Spine 2 View  Result Date: 12/17/2016 CLINICAL DATA:  Fall.  Neck and  back pain. EXAM: THORACIC SPINE 2 VIEWS COMPARISON:  Thoracic spine MRI 07/11/2011. FINDINGS: AP and lateral views. The AP view demonstrates S-shaped thoracic spine curvature. The lateral view is suboptimal secondary to patient positioning, with the arms not raised above the head. Compression deformity at T12 is severe, grossly similar to 2013. Remainder of vertebral body height is grossly maintained through the top of T2; the cervical spine CT includes through the bottom of T3. Expected for age spondylosis. L1 level not well evaluated on the lateral view. IMPRESSION: Moderate degradation, as detailed above. Severe T12 compression deformity, grossly similar to 07/11/2011 MRI. No convincing evidence of acute superimposed injury. Electronically Signed   By: Abigail Miyamoto M.D.   On: 12/17/2016 21:33   Dg Shoulder Right  Result Date: 12/17/2016 CLINICAL DATA:  Fall.  Pain. EXAM: RIGHT SHOULDER - 2+ VIEW COMPARISON:  None. FINDINGS: There is no evidence of fracture or dislocation. There is no evidence of arthropathy or other focal bone abnormality. Soft tissues are unremarkable. Osteopenia. IMPRESSION: Negative. Electronically Signed   By: Staci Righter M.D.   On: 12/17/2016 21:30   Ct Head Wo Contrast  Result Date: 12/17/2016 CLINICAL DATA:  [place] from a wheelchair today. Face swelling. Neck pain. Laceration to the head. EXAM: CT HEAD WITHOUT CONTRAST CT MAXILLOFACIAL WITHOUT CONTRAST CT CERVICAL SPINE WITHOUT CONTRAST TECHNIQUE: Multidetector CT imaging  of the head, cervical spine, and maxillofacial structures were performed using the standard protocol without intravenous contrast. Multiplanar CT image reconstructions of the cervical spine and maxillofacial structures were also generated. COMPARISON:  CT head 01/30/2016. FINDINGS: CT HEAD FINDINGS Brain: No evidence for acute infarction, hemorrhage, mass lesion, hydrocephalus, or extra-axial fluid. Generalized atrophy. Hypoattenuation of white matter  representing small vessel disease. Vascular: Calcification of the cavernous internal carotid arteries consistent with cerebrovascular atherosclerotic disease. No signs of intracranial large vessel occlusion. Skull: Normal. Negative for fracture or focal lesion. Other: None. CT MAXILLOFACIAL FINDINGS Osseous: RIGHT lateral orbital wall fracture, inward buckling. RIGHT zygoma fracture, midportion, slight inward displacement. RIGHT anterior maxillary wall fracture, at the zygomaticomaxillary suture. Minimal nondisplaced orbital floor fracture. Mandible intact. Minimal posterolateral RIGHT maxillary sinus fracture. No definite pterygoid fracture. Orbits: The marked preseptal periorbital soft tissue swelling on the RIGHT. BILATERAL cataract extraction. Orbital floor fracture contributes to minimal orbital emphysema, but no retro bulb are or postseptal hemorrhage. No muscle entrapment. Sinuses: Layering hemorrhage in the RIGHT maxillary sinus reflects adjacent fractures. There is stranding of the retro antral. Soft tissues: RIGHT facial soft tissue swelling. CT CERVICAL SPINE FINDINGS Alignment: Reversal of the normal cervical lordotic curvature. Degenerative anterolisthesis at C2-3 of 2 mm, C3-4 3 mm. No traumatic subluxation. Skull base and vertebrae: No acute fracture. No primary bone lesion or focal pathologic process. Soft tissues and spinal canal: No prevertebral fluid or swelling. No visible canal hematoma. Atherosclerosis. Distended jugular veins, query RIGHT heart strain. Disc levels: Severe disc space narrowing at multiple levels from C3-4 through C7-T1. Osseous spurring and calcific disc material contribute to stenosis, worst at C5-C6. Upper chest: Great vessel atherosclerosis. No pneumothorax. Large LEFT and moderate RIGHT pleural effusiona. Other: None. IMPRESSION: Atrophy and small vessel disease. No skull fracture or intracranial hemorrhage. Advanced cervical spondylosis. No cervical spine fracture or  traumatic subluxation. Large LEFT and moderate RIGHT pleural effusions. Multiple nondisplaced or minimally displaced RIGHT facial fractures, including zygoma, lateral orbital wall and floor, as well as the anterior and lateral maxillary sinus walls. RIGHT maxillary hemosinus. Minimal orbital emphysema without entrapment of the orbital musculature. RIGHT facial soft tissue swelling and preseptal periorbital hematoma, but no significant postseptal/retrobulbar hemorrhage in the RIGHT orbit. Electronically Signed   By: Staci Righter M.D.   On: 12/17/2016 21:29   Ct Cervical Spine Wo Contrast  Result Date: 12/17/2016 CLINICAL DATA:  [place] from a wheelchair today. Face swelling. Neck pain. Laceration to the head. EXAM: CT HEAD WITHOUT CONTRAST CT MAXILLOFACIAL WITHOUT CONTRAST CT CERVICAL SPINE WITHOUT CONTRAST TECHNIQUE: Multidetector CT imaging of the head, cervical spine, and maxillofacial structures were performed using the standard protocol without intravenous contrast. Multiplanar CT image reconstructions of the cervical spine and maxillofacial structures were also generated. COMPARISON:  CT head 01/30/2016. FINDINGS: CT HEAD FINDINGS Brain: No evidence for acute infarction, hemorrhage, mass lesion, hydrocephalus, or extra-axial fluid. Generalized atrophy. Hypoattenuation of white matter representing small vessel disease. Vascular: Calcification of the cavernous internal carotid arteries consistent with cerebrovascular atherosclerotic disease. No signs of intracranial large vessel occlusion. Skull: Normal. Negative for fracture or focal lesion. Other: None. CT MAXILLOFACIAL FINDINGS Osseous: RIGHT lateral orbital wall fracture, inward buckling. RIGHT zygoma fracture, midportion, slight inward displacement. RIGHT anterior maxillary wall fracture, at the zygomaticomaxillary suture. Minimal nondisplaced orbital floor fracture. Mandible intact. Minimal posterolateral RIGHT maxillary sinus fracture. No definite  pterygoid fracture. Orbits: The marked preseptal periorbital soft tissue swelling on the RIGHT. BILATERAL cataract extraction. Orbital floor fracture contributes to  minimal orbital emphysema, but no retro bulb are or postseptal hemorrhage. No muscle entrapment. Sinuses: Layering hemorrhage in the RIGHT maxillary sinus reflects adjacent fractures. There is stranding of the retro antral. Soft tissues: RIGHT facial soft tissue swelling. CT CERVICAL SPINE FINDINGS Alignment: Reversal of the normal cervical lordotic curvature. Degenerative anterolisthesis at C2-3 of 2 mm, C3-4 3 mm. No traumatic subluxation. Skull base and vertebrae: No acute fracture. No primary bone lesion or focal pathologic process. Soft tissues and spinal canal: No prevertebral fluid or swelling. No visible canal hematoma. Atherosclerosis. Distended jugular veins, query RIGHT heart strain. Disc levels: Severe disc space narrowing at multiple levels from C3-4 through C7-T1. Osseous spurring and calcific disc material contribute to stenosis, worst at C5-C6. Upper chest: Great vessel atherosclerosis. No pneumothorax. Large LEFT and moderate RIGHT pleural effusiona. Other: None. IMPRESSION: Atrophy and small vessel disease. No skull fracture or intracranial hemorrhage. Advanced cervical spondylosis. No cervical spine fracture or traumatic subluxation. Large LEFT and moderate RIGHT pleural effusions. Multiple nondisplaced or minimally displaced RIGHT facial fractures, including zygoma, lateral orbital wall and floor, as well as the anterior and lateral maxillary sinus walls. RIGHT maxillary hemosinus. Minimal orbital emphysema without entrapment of the orbital musculature. RIGHT facial soft tissue swelling and preseptal periorbital hematoma, but no significant postseptal/retrobulbar hemorrhage in the RIGHT orbit. Electronically Signed   By: Staci Righter M.D.   On: 12/17/2016 21:29   US Venous Img Lower Unilateral Left  Result Date:  12/18/2016 CLINICAL DATA:  83 year old female with left leg swelling. EXAM: Left LOWER EXTREMITY VENOUS DOPPLER ULTRASOUND TECHNIQUE: Gray-scale sonography with graded compression, as well as color Doppler and duplex ultrasound were performed to evaluate the lower extremity deep venous systems from the level of the common femoral vein and including the common femoral, femoral, profunda femoral, popliteal and calf veins including the posterior tibial, peroneal and gastrocnemius veins when visible. The superficial great saphenous vein was also interrogated. Spectral Doppler was utilized to evaluate flow at rest and with distal augmentation maneuvers in the common femoral, femoral and popliteal veins. COMPARISON:  Ultrasound dated 11/06/2015 FINDINGS: Contralateral Common Femoral Vein: Respiratory phasicity is normal and symmetric with the symptomatic side. No evidence of thrombus. Normal compressibility. Common Femoral Vein: No evidence of thrombus. Normal compressibility, respiratory phasicity and response to augmentation. Saphenofemoral Junction: No evidence of thrombus. Normal compressibility and flow on color Doppler imaging. Profunda Femoral Vein: No evidence of thrombus. Normal compressibility and flow on color Doppler imaging. Femoral Vein: No evidence of thrombus. Normal compressibility, respiratory phasicity and response to augmentation. Popliteal Vein: No evidence of thrombus. Normal compressibility, respiratory phasicity and response to augmentation. Calf Veins: No evidence of thrombus. Normal compressibility and flow on color Doppler imaging. Superficial Great Saphenous Vein: No evidence of thrombus. Normal compressibility. Venous Reflux:  None. Other Findings: A 3.6 x 0.7 x 2.3 cm fluid collection in the left popliteal fossa noted which may represent a Baker's cyst. IMPRESSION: No evidence of deep venous thrombosis in the left lower extremity. Small fluid collection in the left popliteal fossa, possibly  a Baker's cyst. Electronically Signed   By: Anner Crete M.D.   On: 12/18/2016 01:54   Ct Maxillofacial Wo Contrast  Result Date: 12/17/2016 CLINICAL DATA:  [place] from a wheelchair today. Face swelling. Neck pain. Laceration to the head. EXAM: CT HEAD WITHOUT CONTRAST CT MAXILLOFACIAL WITHOUT CONTRAST CT CERVICAL SPINE WITHOUT CONTRAST TECHNIQUE: Multidetector CT imaging of the head, cervical spine, and maxillofacial structures were performed using the standard protocol without  intravenous contrast. Multiplanar CT image reconstructions of the cervical spine and maxillofacial structures were also generated. COMPARISON:  CT head 01/30/2016. FINDINGS: CT HEAD FINDINGS Brain: No evidence for acute infarction, hemorrhage, mass lesion, hydrocephalus, or extra-axial fluid. Generalized atrophy. Hypoattenuation of white matter representing small vessel disease. Vascular: Calcification of the cavernous internal carotid arteries consistent with cerebrovascular atherosclerotic disease. No signs of intracranial large vessel occlusion. Skull: Normal. Negative for fracture or focal lesion. Other: None. CT MAXILLOFACIAL FINDINGS Osseous: RIGHT lateral orbital wall fracture, inward buckling. RIGHT zygoma fracture, midportion, slight inward displacement. RIGHT anterior maxillary wall fracture, at the zygomaticomaxillary suture. Minimal nondisplaced orbital floor fracture. Mandible intact. Minimal posterolateral RIGHT maxillary sinus fracture. No definite pterygoid fracture. Orbits: The marked preseptal periorbital soft tissue swelling on the RIGHT. BILATERAL cataract extraction. Orbital floor fracture contributes to minimal orbital emphysema, but no retro bulb are or postseptal hemorrhage. No muscle entrapment. Sinuses: Layering hemorrhage in the RIGHT maxillary sinus reflects adjacent fractures. There is stranding of the retro antral. Soft tissues: RIGHT facial soft tissue swelling. CT CERVICAL SPINE FINDINGS Alignment:  Reversal of the normal cervical lordotic curvature. Degenerative anterolisthesis at C2-3 of 2 mm, C3-4 3 mm. No traumatic subluxation. Skull base and vertebrae: No acute fracture. No primary bone lesion or focal pathologic process. Soft tissues and spinal canal: No prevertebral fluid or swelling. No visible canal hematoma. Atherosclerosis. Distended jugular veins, query RIGHT heart strain. Disc levels: Severe disc space narrowing at multiple levels from C3-4 through C7-T1. Osseous spurring and calcific disc material contribute to stenosis, worst at C5-C6. Upper chest: Great vessel atherosclerosis. No pneumothorax. Large LEFT and moderate RIGHT pleural effusiona. Other: None. IMPRESSION: Atrophy and small vessel disease. No skull fracture or intracranial hemorrhage. Advanced cervical spondylosis. No cervical spine fracture or traumatic subluxation. Large LEFT and moderate RIGHT pleural effusions. Multiple nondisplaced or minimally displaced RIGHT facial fractures, including zygoma, lateral orbital wall and floor, as well as the anterior and lateral maxillary sinus walls. RIGHT maxillary hemosinus. Minimal orbital emphysema without entrapment of the orbital musculature. RIGHT facial soft tissue swelling and preseptal periorbital hematoma, but no significant postseptal/retrobulbar hemorrhage in the RIGHT orbit. Electronically Signed   By: Staci Righter M.D.   On: 12/17/2016 21:29    EKG: Orders placed or performed during the hospital encounter of 12/17/16  . ED EKG  . ED EKG  . EKG 12-Lead  . EKG 12-Lead    IMPRESSION AND PLAN: 83 year old elderly female patient with a resident of nursing home with history of dysarthria, hypertension, hyperlipidemia, spinal stenosis, Wolff-Parkinson-White syndrome presented to the emergency for fall and facial trauma.  Admitting diagnosis 1. Right facial fracture 2. Right preseptal periorbital hematoma 3. Accidental fall 4. Hypertension 5. Hyperlipidemia 6. Pleural  effusion  Treatment plan Admit patient to medical floor Hold aspirin and Plavix ENT consultation for facial fracture Ophthalmology evaluation for preseptal periorbital hematoma Hold blood thinner medication Resume blood pressure medications Pain management All the records are reviewed and case discussed with ED provider. Management plans discussed with the patient, family and they are in agreement.  CODE STATUS:FULL CODE Code Status History    Date Active Date Inactive Code Status Order ID Comments User Context   01/31/2016  3:35 AM 02/05/2016  3:21 PM Full Code [id number]  Etta Quill, DO Inpatient   11/06/2015  2:04 PM 11/06/2015  3:26 PM Full Code [id number]  Theodoro Grist, MD Inpatient    Advance Directive Documentation     Most Recent Value  Type of  Advance Directive  Living will  Pre-existing out of facility DNR order (yellow form or pink MOST form)  -  "MOST" Form in Place?  -       TOTAL TIME TAKING CARE OF THIS PATIENT: 50 minutes.    Saundra Shelling M.D on 12/18/2016 at 2:58 AM  Between 7am to 6pm - Pager - [phone]  After 6pm go to [email or web address] - password EPAS [facility]  [facility]  Office  [phone]  CC: Primary care physician; Lynnell Jude, MD

## 2016-12-18 NOTE — Consult Note (Signed)
[name], [name] [id number] Nov 14, 1923 [name], *  Reason for Consult: Evaluate because of traumatic facial factors  HPI: The patient is a 83-year-old white female who said multiple medical challenges including dysarthria, hyperlipidemia, hypertension, arthritis, GERD, Wolff-Parkinson-White syndrome, and living in a nursing home. She was sitting in a wheelchair which is her normal activity and fell forward onto the ground hitting her face. She was seen in the emergency room showed lots of bruising and swelling around her right eye and upper face. Multiple CT scans were done which showed no evidence of spinal fractured but she had some minimally displaced or nondisplaced fractures of the right periorbital area was swelling around the orbit. Assessment was made of her eyes and airway to see if she needs any surgical repair for this. She breathes well through her nose not having bleeding. Her vision appears to be okay as before. She had a cataract surgery done 10 days ago and was evaluated 2 days ago postoperatively and everything looked good. She is scheduled see ophthalmology again. She does not have any vertigo or hearing change. She can eat and her jaw feels like it lines up for her.  Allergies: No Known Allergies  ROS: Review of systems normal other than 12 systems except per HPI.  PMH:  Past Medical History:  Diagnosis Date  . Anemia   . Arthritis    OSTEO  . Cancer (HCC)    BREAST  . Cerebrovascular spasm   . Cognitive communication deficit   . Constipation   . Depression   . Dysarthria and anarthria   . GERD (gastroesophageal reflux disease)   . Hearing loss   . Hyperlipidemia   . Hypertension   . Hypo-osmolality and hyponatremia   . IBS (irritable bowel syndrome)   . Osteoporosis   . Overactive bladder   . Pneumonia 08/16/2016   on antibiotic  . Pre-excitation syndrome   . Sciatica   . Spinal stenosis   . Swallowing difficulty   . TIA (transient ischemic attack)    . TIA (transient ischemic attack)    SYNCOPE  . Vitamin deficiency   . WPW (Wolff-Parkinson-White syndrome)     FH:  Family History  Problem Relation Age of Onset  . Lymphoma Brother   . CAD Brother   . Prostate cancer Brother   . Cancer Mother   . Cancer Father     SH:  Social History   Social History  . Marital status: Widowed    Spouse name: N/A  . Number of children: N/A  . Years of education: N/A   Occupational History  . retired    Social History Main Topics  . Smoking status: Never Smoker  . Smokeless tobacco: Never Used  . Alcohol use No  . Drug use: No  . Sexual activity: No   Other Topics Concern  . Not on file   Social History Narrative  . No narrative on file    PSH:  Past Surgical History:  Procedure Laterality Date  . ABDOMINAL HYSTERECTOMY    . CATARACT EXTRACTION W/PHACO Left 08/28/2016   Procedure: CATARACT EXTRACTION PHACO AND INTRAOCULAR LENS PLACEMENT ([place])  Left;  Surgeon: Leandrew Koyanagi, MD;  Location: [place];  Service: Ophthalmology;  Laterality: Left;  Patient needs wheelchair  . CATARACT EXTRACTION W/PHACO Right 12/09/2016   Procedure: CATARACT EXTRACTION PHACO AND INTRAOCULAR LENS PLACEMENT ([place]) RIGHT;  Surgeon: Leandrew Koyanagi, MD;  Location: [place];  Service: Ophthalmology;  Laterality: Right;  . MASTECTOMY Left   .  ORIF FEMUR FRACTURE Right 01/31/2016   Procedure: OPEN REDUCTION INTERNAL FIXATION DISTAL FEMUR FRACTURE;  Surgeon: Rod Can, MD;  Location: [place];  Service: Orthopedics;  Laterality: Right;  . REPLACEMENT TOTAL KNEE Right     Physical  Exam: A frail white female sitting in bed. CN 2-12 grossly intact and symmetric. She has significant swelling the right periorbital area with bruising throughout the right cheek and periorbital. EAC/TMs normal BL. Oral cavity, lips, gums, ororpharynx normal with no masses or lesions. Her jaw seems to move and midline. Skin warm and dry. Nasal  cavity without polyps or purulence. External nose and ears without masses or lesions. No bruising the nose or signs of fracture here.  Neck supple with no masses or lesions. No lymphadenopathy palpated. She has bruising and swelling the right cheek but I don't feel any bony step-offs of her skeletal bone. The orbital rim appears to be intact. The zygoma appears to be smooth although there is some swelling around it and slightly tender there.  I reviewed the CT scan of her facial bones in detail. It shows some minimally displaced or nondisplaced fractures round right periorbita. There is some blood in the sinuses but no sign of any significant displacement or obstruction..   A/P: She has minimally displaced or nondisplaced fractures or facial bones from recent trauma. She has significant bruising which should settle down on its own. She does not need surgery at all. I have discussed this at length with her son's and they understand. She does not need specific return visit to the ENT clinic unless there are challenges that should arise. She does need further evaluation by ophthalmology to make sure that there is no orbital trauma or change in where the implant was in her right eye after cataract surgery. She can stay on antibiotics for about a week to make sure that there is no infection in the blood in the sinuses. She doesn't need antibiotics long-term nor other medications position all clear on its own.   [name] H 12/18/2016 12:54 PM

## 2016-12-18 NOTE — ED Notes (Signed)
Pt had wet the bed; pt's briefs changed, pt was cleaned, complete bed linen change, and pt's gown was changed.

## 2016-12-18 NOTE — Progress Notes (Signed)
Patient is medically stable for D/C back to [facility] today. Per [facility] admissions coordinator at [facility] patient can come today to room D-13. RN will call report and arrange EMS for transport. Clinical Education officer, museum (CSW) sent D/C orders to [facility] via [employer]. Patient is aware of above. Patient's son [name] is at bedside and aware of above. Please reconsult if future social work needs arise. CSW signing off.   [employer], LCSW [phone]

## 2016-12-18 NOTE — Discharge Summary (Signed)
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  "MOST" Form in Place?  -      TOTAL TIME TAKING CARE OF THIS PATIENT: 35 minutes.    [name] M.D on 12/18/2016 at 4:03 PM  Between 7am to 6pm - Pager - [phone]  After 6pm go to [email or web address] - password [email or web address] [facility]  Office  [phone]  CC: Primary care physician; Lynnell Jude, MD   Note: This dictation was prepared with Dragon dictation along with smaller phrase technology. Any transcriptional errors that result from this process are unintentional.

## 2016-12-18 NOTE — Care Management CC44 (Signed)
Condition Code 44 Documentation Completed  Patient Details  Name: [name] MRN: [id number] Date of Birth: 06-Nov-1923   Condition Code 44 given:  Yes Patient signature on Condition Code 44 notice:  Yes Documentation of 2 MD's agreement:  Yes Code 44 added to claim:  Yes    Jolly Mango, RN 12/18/2016, 2:37 PM

## 2016-12-18 NOTE — Care Management Obs Status (Signed)
[facility] NOTIFICATION   Patient Details  Name: [name] MRN: [id number] Date of Birth: [birth year]   Medicare Observation Status Notification Given:  Yes    Jolly Mango, RN 12/18/2016, 2:37 PM

## 2016-12-18 NOTE — Clinical Social Work Note (Signed)
Clinical Social Work Assessment  Patient Details  Name: [name] MRN: [id number] Date of Birth: 1923/09/20  Date of referral:  12/18/16               Reason for consult:  Other (Comment Required) (From [facility] )                Permission sought to share information with:  Facility Art therapist granted to share information::  Yes, Verbal Permission Granted  Name::      [facility]   Agency::     Relationship::     Contact Information:     Housing/Transportation Living arrangements for the past 2 months:  [place] of Information:  Patient, Adult Children, Facility Patient Interpreter Needed:  None Criminal Activity/Legal Involvement Pertinent to Current Situation/Hospitalization:  No - Comment as needed Significant Relationships:  Adult Children Lives with:  Facility Resident Do you feel safe going back to the place where you live?  Yes Need for family participation in patient care:  Yes (Comment)  Care giving concerns:  Patient is a long term care resident at [facility].    Social Worker assessment / plan:  Holiday representative ([place]) reviewed chart and noted that patient is from [name]. CSW contacted [facility] admissions coordinator at [facility] to get additional information. Per Liliane Channel patient is a long term care SNF resident and can return to [facility] when stable. Per Liliane Channel patient has been at [facility] close to 1 year. CSW met with patient and her son [name] [phone] was at bedside. Patient was alert and oriented and was sitting up in the bed. CSW introduced self and explained role of CSW department. Per son patient was at the independent living section at [facility] before moving to the SNF side 1 year ago. Per [name] patient has 2 adult children, himself and his brother [name]. Per Lorenza Evangelist is patient's HPOA. Patient and her son [name] are agreeable for patient to return to [facility] when stable. 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 CSW provided emotional support. FL2 complete. CSW will continue to follow and assist as needed.   Employment status:  Retired, Disabled (Comment on whether or not currently receiving Disability) Insurance information:  Medicare PT Recommendations:  Not assessed at this time Information / Referral to community resources:  [place]  Patient/Family's Response to care:  Patient is agreeable to return to [facility].   Patient/Family's Understanding of and Emotional Response to Diagnosis, Current Treatment, and Prognosis:  Patient and her son were very pleasant and thanked CSW for assistance.   Emotional Assessment Appearance:  Appears stated age Attitude/Demeanor/Rapport:    Affect (typically observed):  Accepting, Adaptable, Pleasant Orientation:  Oriented to Self, Oriented to Place, Oriented to  Time, Fluctuating Orientation (Suspected and/or reported Sundowners) Alcohol / Substance use:  Not Applicable Psych involvement (Current and /or in the community):  No (Comment)  Discharge Needs  Concerns to be addressed:  Discharge Planning Concerns Readmission within the last 30 days:  No Current discharge risk:  Dependent with Mobility, Chronically ill Barriers to Discharge:  Continued Medical Work up   [employer], Veronia Beets, LCSW 12/18/2016, 11:09 AM

## 2016-12-18 NOTE — Discharge Planning (Signed)
Patient IV removed.  RN assessment and VS revealed stability for DC back to [facility].  Discharge papers given, explained and educated.  Informed of suggested FU appt and thje in-house Dr. Aletha Halim fulfill as needed.  Script printed and AVS printed and placed in packet. Report called and s/w Carmel [place], RN. EMS contacted to transport to room D-13. Family informed of DC and POC. Awaiting EMS arrival

## 2016-12-19 LAB — URINE CULTURE: Culture: 50000 — AB

## 2016-12-23 LAB — CULTURE, BLOOD (ROUTINE X 2)
Culture: NO GROWTH
Culture: NO GROWTH
SPECIAL REQUESTS: ADEQUATE
SPECIAL REQUESTS: ADEQUATE

## 2017-07-19 DEATH — deceased

## 2018-03-17 IMAGING — CR DG FEMUR 2+V*R*
4 series · 4 of 4 positions shown · non-contrast
Comparison: None.

CLINICAL DATA: [AGE] female with fall and right femoral pain.

EXAM:
RIGHT FEMUR 2 VIEWS

[femur ap (1 of 2)]
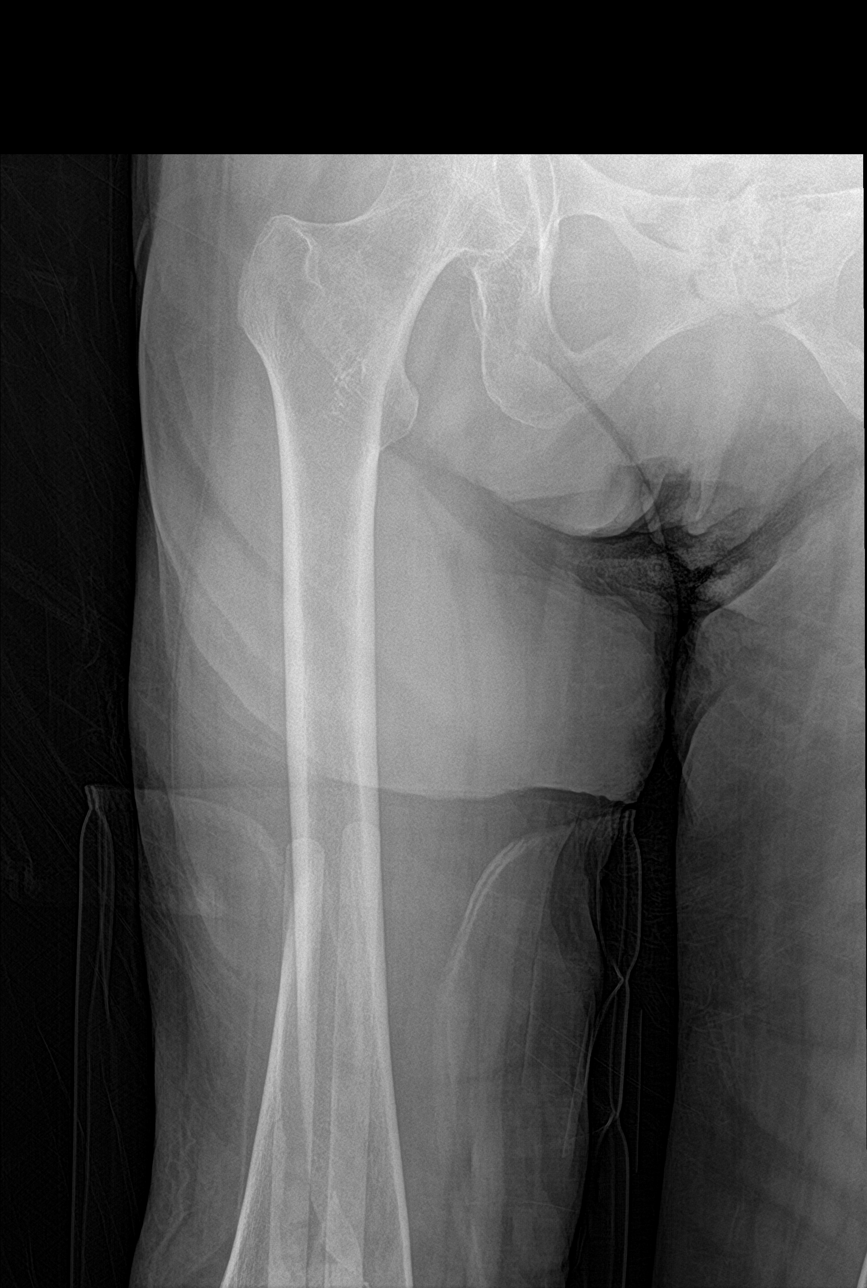

[femur lat (1 of 2)]
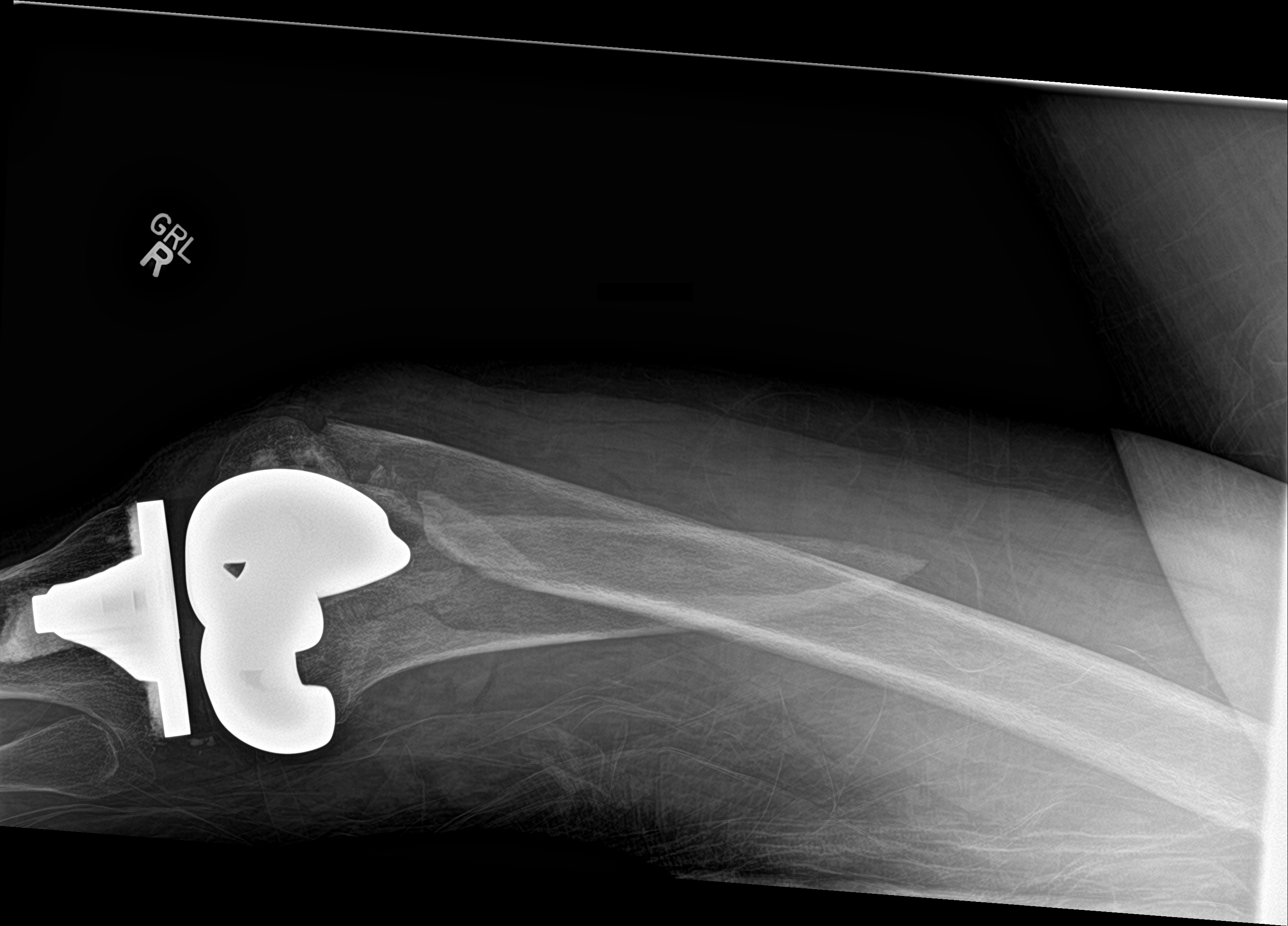

[femur ap (2 of 2)]
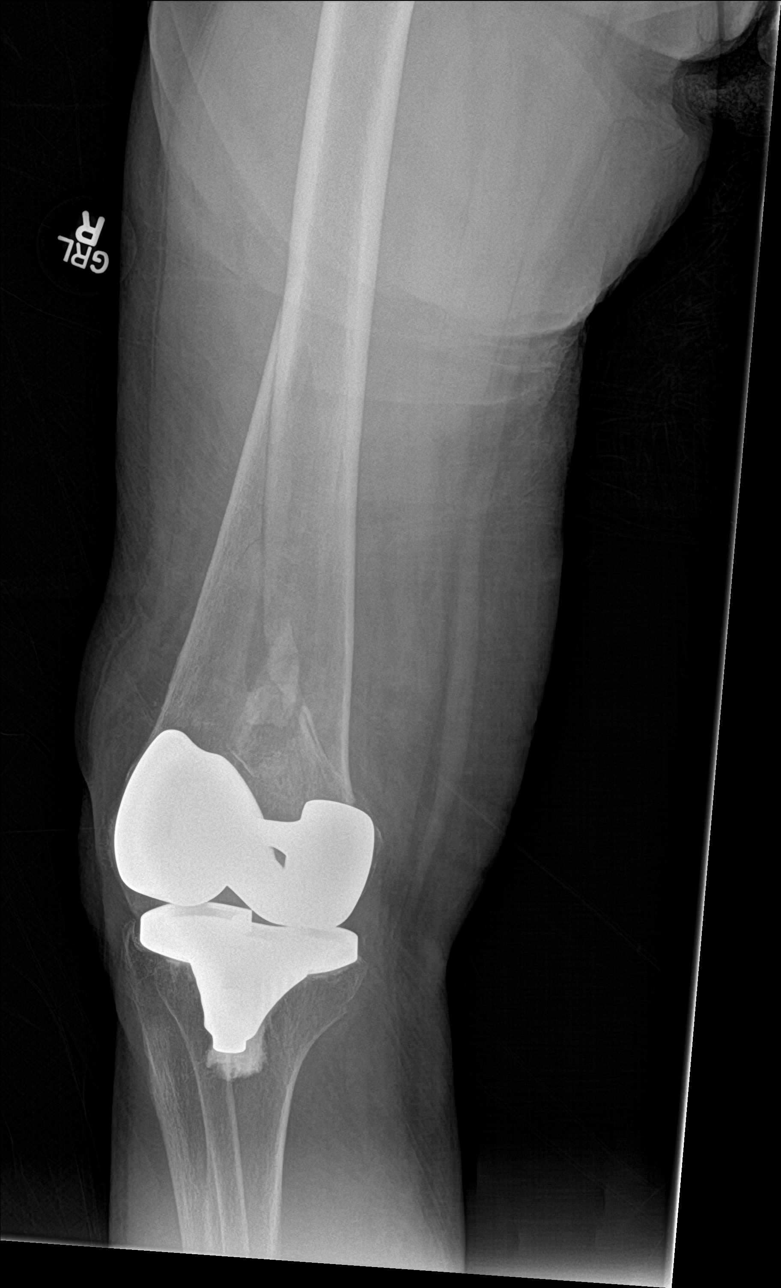

[femur lat (2 of 2)]
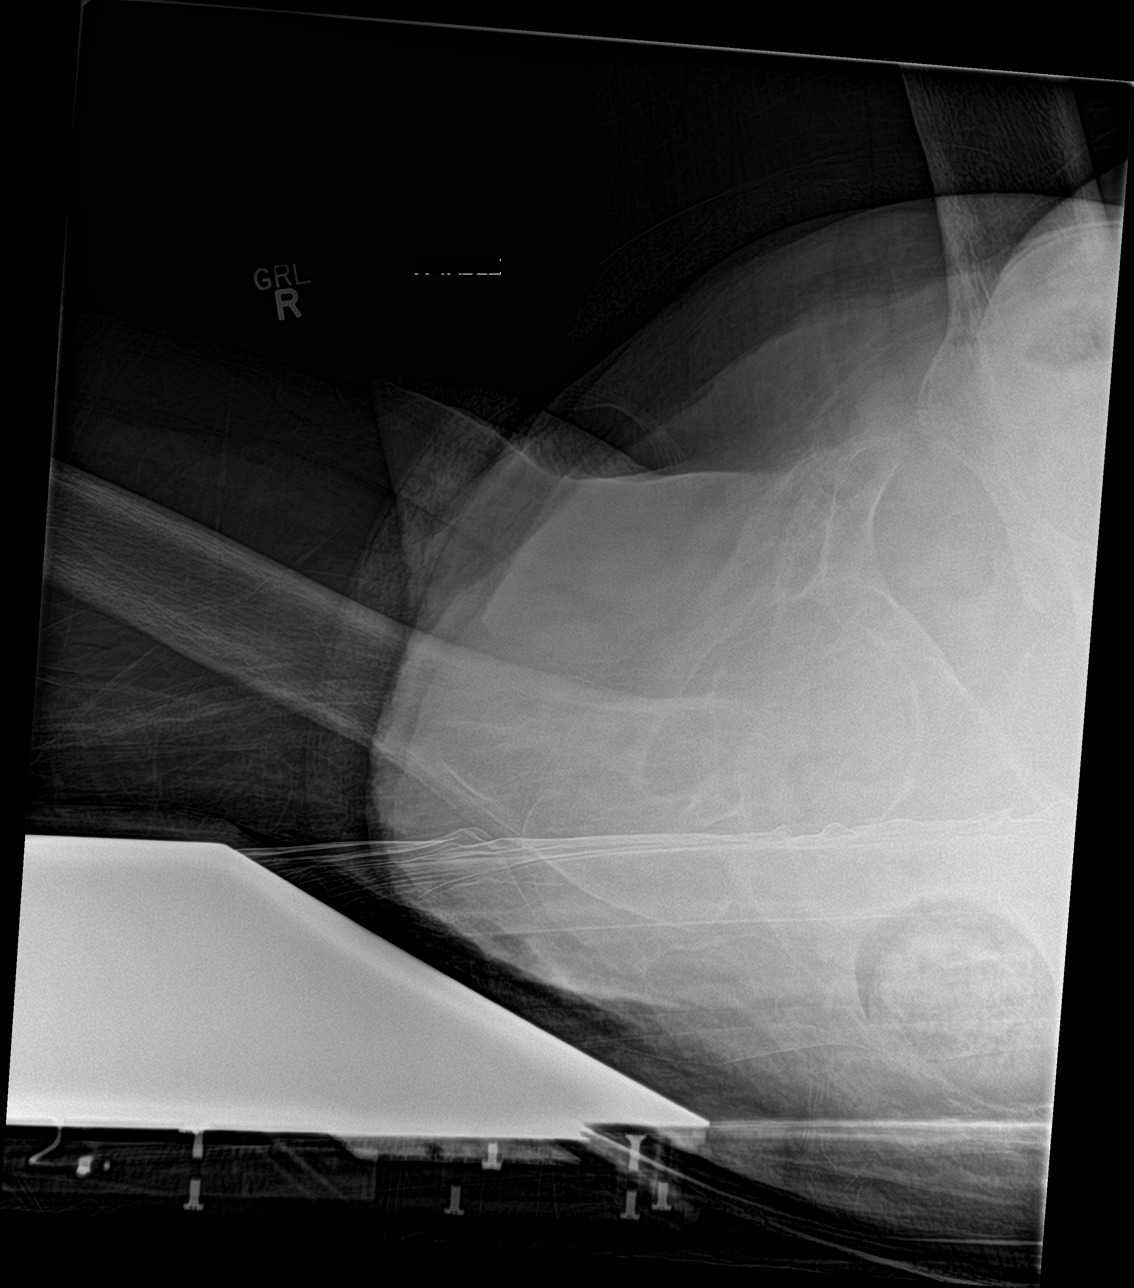

[4 of 4 positions shown; findings below may reference images not displayed]

FINDINGS: There is a displaced oblique and comminuted fracture of the distal
femur. There is extension of the fracture to the femoral component
of the knee arthroplasty. There is posterior angulation and
displacement of the main distal fracture fragment in relation to the
femoral shaft. The bones are osteopenic.

There is a total right knee arthroplasty. The arthroplasty
components appear intact and in anatomic alignment. The soft tissues
are grossly unremarkable. No significant joint effusion identified,
however evaluation for the suprapatellar joint is somewhat limited
due to superimposition of the fracture fragments on the cross-table
lateral view.
IMPRESSION: Comminuted and displaced oblique fracture of the distal femur with
extension of the fracture to the femoral component of the
arthroplasty. Orthopedic consult is advised.

## 2018-03-17 IMAGING — CR DG PELVIS 1-2V
1 series · 1 of 1 positions shown · non-contrast
Comparison: Pelvic radiograph January 20, 2012

CLINICAL DATA: Fall today at [HOSPITAL]. RIGHT distal femur
bruising.

EXAM:
PELVIS - 1-2 VIEW

[pelvis ap]
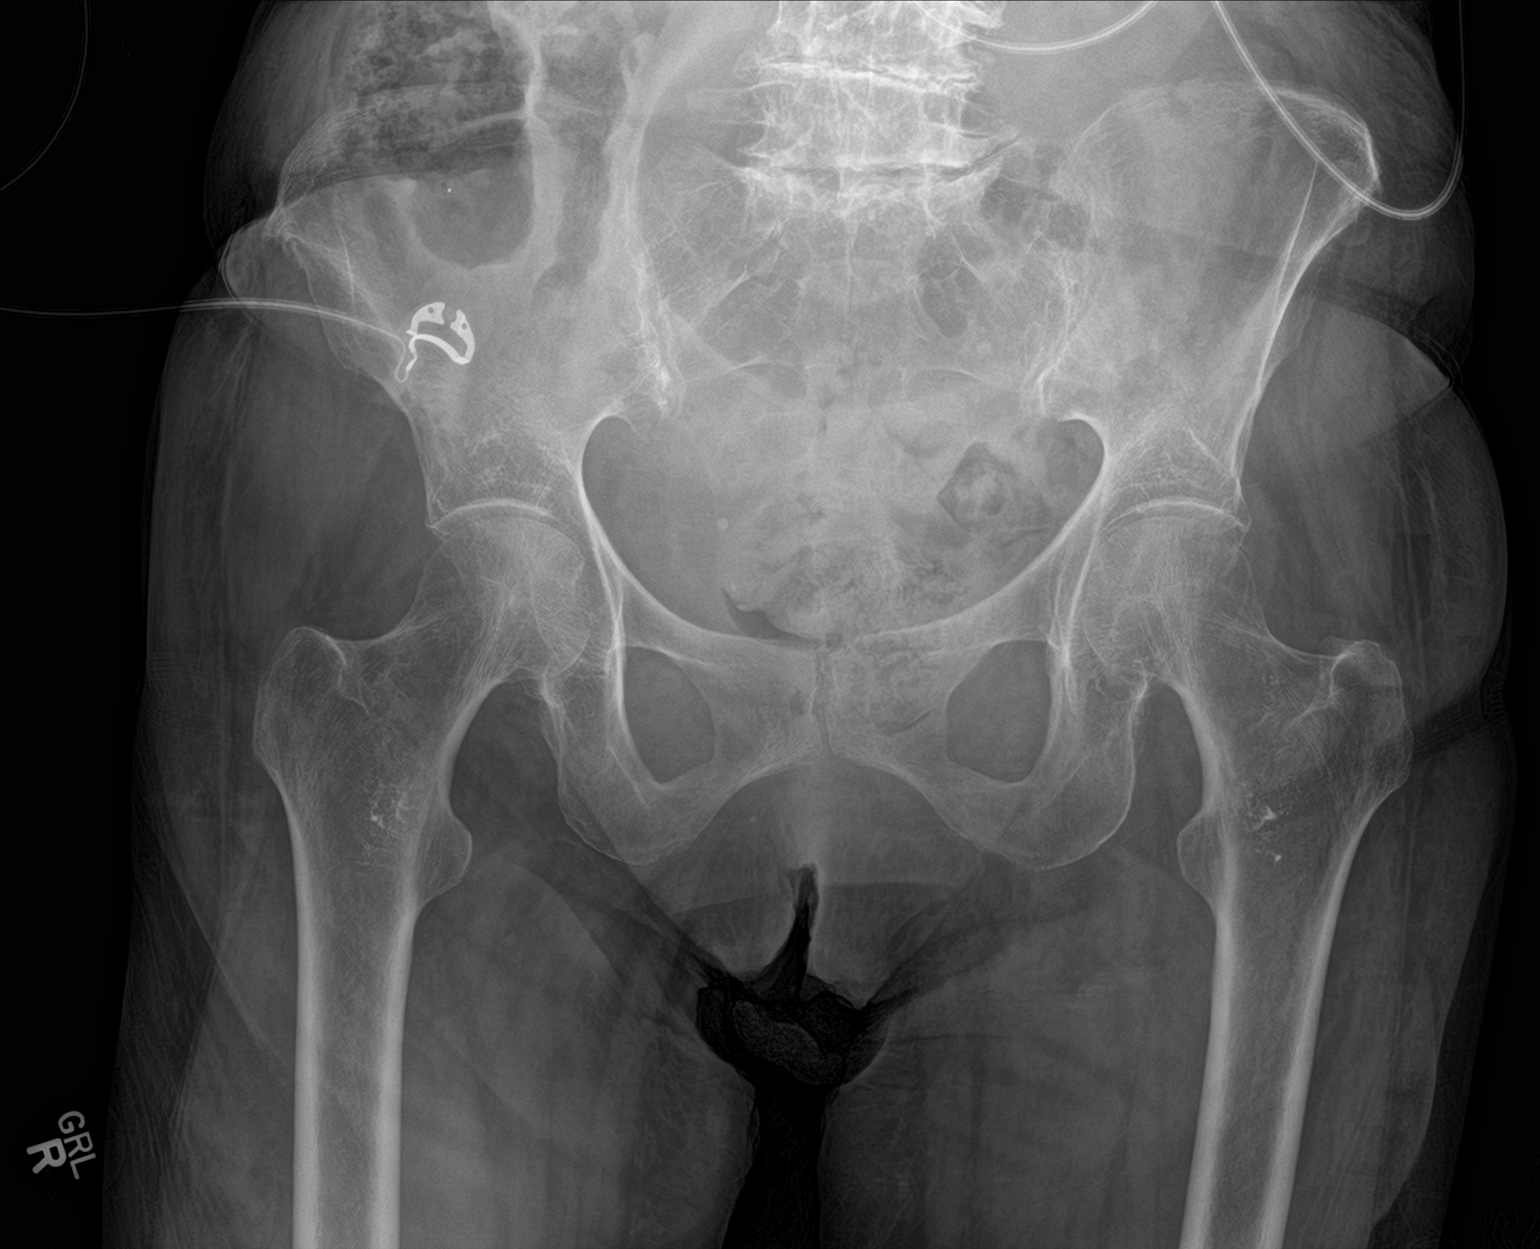

[1 of 1 positions shown; findings below may reference images not displayed]

FINDINGS: There is no evidence of pelvic fracture or diastasis. No pelvic bone
lesions are seen. Osteopenia. Severe degenerative change of the
included cervical spine.
IMPRESSION: Negative.
